# Patient Record
Sex: Male | Born: 1960 | Race: White | Hispanic: No | Smoking: Current every day smoker
Health system: Southern US, Community
[De-identification: ages and names within clinical notes are randomized; demographics above are authoritative.]

## PROBLEM LIST (undated history)

## (undated) DIAGNOSIS — F418 Other specified anxiety disorders: Secondary | ICD-10-CM

## (undated) DIAGNOSIS — Z992 Dependence on renal dialysis: Secondary | ICD-10-CM

## (undated) DIAGNOSIS — F1010 Alcohol abuse, uncomplicated: Secondary | ICD-10-CM

## (undated) DIAGNOSIS — K2210 Ulcer of esophagus without bleeding: Secondary | ICD-10-CM

## (undated) DIAGNOSIS — A048 Other specified bacterial intestinal infections: Secondary | ICD-10-CM

## (undated) DIAGNOSIS — N186 End stage renal disease: Secondary | ICD-10-CM

## (undated) DIAGNOSIS — J90 Pleural effusion, not elsewhere classified: Secondary | ICD-10-CM

## (undated) DIAGNOSIS — R0602 Shortness of breath: Secondary | ICD-10-CM

## (undated) DIAGNOSIS — R0989 Other specified symptoms and signs involving the circulatory and respiratory systems: Secondary | ICD-10-CM

## (undated) DIAGNOSIS — J942 Hemothorax: Secondary | ICD-10-CM

## (undated) DIAGNOSIS — I10 Essential (primary) hypertension: Secondary | ICD-10-CM

## (undated) DIAGNOSIS — Z720 Tobacco use: Secondary | ICD-10-CM

## (undated) DIAGNOSIS — M1990 Unspecified osteoarthritis, unspecified site: Secondary | ICD-10-CM

## (undated) HISTORY — DX: End stage renal disease: N18.6

## (undated) HISTORY — DX: Alcohol abuse, uncomplicated: F10.10

## (undated) HISTORY — PX: CHOLECYSTECTOMY: SHX55

## (undated) HISTORY — DX: Unspecified osteoarthritis, unspecified site: M19.90

## (undated) HISTORY — DX: Pleural effusion, not elsewhere classified: J90

## (undated) HISTORY — DX: Essential (primary) hypertension: I10

## (undated) HISTORY — DX: Other specified bacterial intestinal infections: A04.8

## (undated) HISTORY — DX: Tobacco use: Z72.0

## (undated) HISTORY — DX: Shortness of breath: R06.02

## (undated) HISTORY — PX: JOINT REPLACEMENT: SHX530

## (undated) HISTORY — PX: PLEURAL EFFUSION DRAINAGE: SHX5099

## (undated) HISTORY — DX: Other specified anxiety disorders: F41.8

## (undated) HISTORY — DX: Ulcer of esophagus without bleeding: K22.10

## (undated) HISTORY — DX: Hemothorax: J94.2

## (undated) HISTORY — DX: Other specified symptoms and signs involving the circulatory and respiratory systems: R09.89

---

## 2008-10-14 ENCOUNTER — Emergency Department (HOSPITAL_COMMUNITY): Admission: EM | Admit: 2008-10-14 | Discharge: 2008-10-14 | Payer: Self-pay | Admitting: Emergency Medicine

## 2008-12-11 HISTORY — PX: AV FISTULA PLACEMENT: SHX1204

## 2009-09-16 ENCOUNTER — Ambulatory Visit: Payer: Self-pay | Admitting: Cardiology

## 2009-10-18 ENCOUNTER — Ambulatory Visit: Payer: Self-pay | Admitting: Surgery

## 2009-10-21 ENCOUNTER — Ambulatory Visit: Payer: Self-pay | Admitting: Surgery

## 2009-10-21 ENCOUNTER — Ambulatory Visit (HOSPITAL_COMMUNITY): Admission: RE | Admit: 2009-10-21 | Discharge: 2009-10-21 | Payer: Self-pay | Admitting: Surgery

## 2009-11-06 ENCOUNTER — Ambulatory Visit (HOSPITAL_COMMUNITY): Admission: EM | Admit: 2009-11-06 | Discharge: 2009-11-06 | Payer: Self-pay | Admitting: Emergency Medicine

## 2009-11-15 ENCOUNTER — Ambulatory Visit: Payer: Self-pay | Admitting: Surgery

## 2009-11-29 ENCOUNTER — Ambulatory Visit: Payer: Self-pay | Admitting: Surgery

## 2009-12-02 ENCOUNTER — Ambulatory Visit: Payer: Self-pay | Admitting: Surgery

## 2009-12-02 ENCOUNTER — Ambulatory Visit (HOSPITAL_COMMUNITY): Admission: RE | Admit: 2009-12-02 | Discharge: 2009-12-02 | Payer: Self-pay | Admitting: Surgery

## 2010-02-18 ENCOUNTER — Ambulatory Visit (HOSPITAL_COMMUNITY): Admission: RE | Admit: 2010-02-18 | Discharge: 2010-02-18 | Payer: Self-pay | Admitting: Nephrology

## 2010-04-24 ENCOUNTER — Emergency Department (HOSPITAL_COMMUNITY): Admission: EM | Admit: 2010-04-24 | Discharge: 2010-04-24 | Payer: Self-pay | Admitting: Emergency Medicine

## 2010-05-11 HISTORY — PX: TOTAL HIP ARTHROPLASTY: SHX124

## 2010-07-16 ENCOUNTER — Emergency Department (HOSPITAL_COMMUNITY): Admission: EM | Admit: 2010-07-16 | Discharge: 2010-07-16 | Payer: Self-pay | Admitting: Emergency Medicine

## 2010-07-17 ENCOUNTER — Emergency Department (HOSPITAL_COMMUNITY): Admission: EM | Admit: 2010-07-17 | Discharge: 2010-07-17 | Payer: Self-pay | Admitting: Emergency Medicine

## 2010-07-24 ENCOUNTER — Emergency Department (HOSPITAL_COMMUNITY): Admission: EM | Admit: 2010-07-24 | Discharge: 2010-07-24 | Payer: Self-pay | Admitting: Emergency Medicine

## 2010-10-07 ENCOUNTER — Emergency Department (HOSPITAL_COMMUNITY): Admission: EM | Admit: 2010-10-07 | Discharge: 2010-10-08 | Payer: Self-pay | Admitting: Emergency Medicine

## 2010-10-12 ENCOUNTER — Emergency Department (HOSPITAL_COMMUNITY): Admission: EM | Admit: 2010-10-12 | Discharge: 2010-10-12 | Payer: Self-pay | Admitting: Emergency Medicine

## 2010-10-22 ENCOUNTER — Emergency Department (HOSPITAL_COMMUNITY): Admission: EM | Admit: 2010-10-22 | Discharge: 2010-10-22 | Payer: Self-pay | Admitting: Emergency Medicine

## 2010-11-10 ENCOUNTER — Emergency Department (HOSPITAL_COMMUNITY)
Admission: EM | Admit: 2010-11-10 | Discharge: 2010-11-11 | Payer: Self-pay | Source: Home / Self Care | Admitting: Psychology

## 2011-02-21 LAB — DIFFERENTIAL
Eosinophils Absolute: 0.3 10*3/uL (ref 0.0–0.7)
Lymphocytes Relative: 22 % (ref 12–46)
Lymphs Abs: 1.7 10*3/uL (ref 0.7–4.0)
Lymphs Abs: 2.3 10*3/uL (ref 0.7–4.0)
Monocytes Absolute: 0.6 10*3/uL (ref 0.1–1.0)
Monocytes Relative: 6 % (ref 3–12)
Monocytes Relative: 7 % (ref 3–12)
Neutro Abs: 5.1 10*3/uL (ref 1.7–7.7)
Neutrophils Relative %: 66 % (ref 43–77)
Neutrophils Relative %: 69 % (ref 43–77)

## 2011-02-21 LAB — COMPREHENSIVE METABOLIC PANEL
CO2: 24 mEq/L (ref 19–32)
Calcium: 8.5 mg/dL (ref 8.4–10.5)
Creatinine, Ser: 8.62 mg/dL — ABNORMAL HIGH (ref 0.4–1.5)
GFR calc non Af Amer: 7 mL/min — ABNORMAL LOW (ref >60)
Glucose, Bld: 111 mg/dL — ABNORMAL HIGH (ref 70–99)
Sodium: 134 mEq/L — ABNORMAL LOW (ref 135–145)
Total Protein: 6.6 g/dL (ref 6.0–8.3)

## 2011-02-21 LAB — CBC
HCT: 31.3 % — ABNORMAL LOW (ref 39.0–52.0)
HCT: 38.2 % — ABNORMAL LOW (ref 39.0–52.0)
Hemoglobin: 10.4 g/dL — ABNORMAL LOW (ref 13.0–17.0)
Hemoglobin: 12.6 g/dL — ABNORMAL LOW (ref 13.0–17.0)
MCH: 28.8 pg (ref 26.0–34.0)
MCH: 28.9 pg (ref 26.0–34.0)
MCHC: 33.2 g/dL (ref 30.0–36.0)
MCV: 87.7 fL (ref 78.0–100.0)
RBC: 4.36 MIL/uL (ref 4.22–5.81)
RDW: 16.2 % — ABNORMAL HIGH (ref 11.5–15.5)

## 2011-02-21 LAB — HEPATIC FUNCTION PANEL
Bilirubin, Direct: 0.2 mg/dL (ref 0.0–0.3)
Indirect Bilirubin: 0.5 mg/dL (ref 0.3–0.9)

## 2011-02-21 LAB — BASIC METABOLIC PANEL
CO2: 23 mEq/L (ref 19–32)
Calcium: 9.4 mg/dL (ref 8.4–10.5)
Chloride: 100 mEq/L (ref 96–112)
GFR calc Af Amer: 9 mL/min — ABNORMAL LOW (ref >60)
Glucose, Bld: 106 mg/dL — ABNORMAL HIGH (ref 70–99)
Potassium: 4.9 mEq/L (ref 3.5–5.1)
Sodium: 136 mEq/L (ref 135–145)

## 2011-02-21 LAB — POCT CARDIAC MARKERS: Myoglobin, poc: 300 ng/mL (ref 12–200)

## 2011-02-21 LAB — LIPASE, BLOOD: Lipase: 98 U/L — ABNORMAL HIGH (ref 11–59)

## 2011-02-22 LAB — CBC
HCT: 34.5 % — ABNORMAL LOW (ref 39.0–52.0)
MCHC: 33.3 g/dL (ref 30.0–36.0)
MCV: 86.1 fL (ref 78.0–100.0)
RDW: 16.4 % — ABNORMAL HIGH (ref 11.5–15.5)

## 2011-02-22 LAB — COMPREHENSIVE METABOLIC PANEL
Alkaline Phosphatase: 80 U/L (ref 39–117)
BUN: 21 mg/dL (ref 6–23)
Calcium: 9 mg/dL (ref 8.4–10.5)
GFR calc non Af Amer: 7 mL/min — ABNORMAL LOW (ref >60)
Glucose, Bld: 102 mg/dL — ABNORMAL HIGH (ref 70–99)
Total Protein: 6.6 g/dL (ref 6.0–8.3)

## 2011-02-22 LAB — DIFFERENTIAL
Basophils Relative: 1 % (ref 0–1)
Monocytes Relative: 5 % (ref 3–12)
Neutro Abs: 6.5 10*3/uL (ref 1.7–7.7)
Neutrophils Relative %: 72 % (ref 43–77)

## 2011-02-22 LAB — LIPASE, BLOOD: Lipase: 52 U/L (ref 11–59)

## 2011-02-24 LAB — BASIC METABOLIC PANEL
CO2: 27 mEq/L (ref 19–32)
CO2: 30 mEq/L (ref 19–32)
Calcium: 8.7 mg/dL (ref 8.4–10.5)
Calcium: 8.8 mg/dL (ref 8.4–10.5)
Chloride: 101 mEq/L (ref 96–112)
GFR calc Af Amer: 10 mL/min — ABNORMAL LOW (ref >60)
GFR calc Af Amer: 13 mL/min — ABNORMAL LOW (ref >60)
GFR calc non Af Amer: 10 mL/min — ABNORMAL LOW (ref >60)
Glucose, Bld: 84 mg/dL (ref 70–99)
Potassium: 4 mEq/L (ref 3.5–5.1)
Sodium: 133 mEq/L — ABNORMAL LOW (ref 135–145)
Sodium: 136 mEq/L (ref 135–145)

## 2011-02-24 LAB — CK TOTAL AND CKMB (NOT AT ARMC): Relative Index: INVALID (ref 0.0–2.5)

## 2011-02-24 LAB — DIFFERENTIAL
Basophils Relative: 1 % (ref 0–1)
Lymphocytes Relative: 31 % (ref 12–46)
Lymphocytes Relative: 36 % (ref 12–46)
Lymphocytes Relative: 37 % (ref 12–46)
Lymphs Abs: 3.1 10*3/uL (ref 0.7–4.0)
Lymphs Abs: 3.3 10*3/uL (ref 0.7–4.0)
Monocytes Absolute: 0.6 10*3/uL (ref 0.1–1.0)
Monocytes Relative: 5 % (ref 3–12)
Monocytes Relative: 6 % (ref 3–12)
Monocytes Relative: 8 % (ref 3–12)
Neutro Abs: 4.2 10*3/uL (ref 1.7–7.7)
Neutro Abs: 4.8 10*3/uL (ref 1.7–7.7)
Neutro Abs: 7 10*3/uL (ref 1.7–7.7)
Neutrophils Relative %: 51 % (ref 43–77)
Neutrophils Relative %: 54 % (ref 43–77)
Neutrophils Relative %: 58 % (ref 43–77)

## 2011-02-24 LAB — CBC
HCT: 31.7 % — ABNORMAL LOW (ref 39.0–52.0)
HCT: 35.1 % — ABNORMAL LOW (ref 39.0–52.0)
Hemoglobin: 10.6 g/dL — ABNORMAL LOW (ref 13.0–17.0)
Hemoglobin: 11.4 g/dL — ABNORMAL LOW (ref 13.0–17.0)
MCH: 28.5 pg (ref 26.0–34.0)
MCH: 29.3 pg (ref 26.0–34.0)
MCHC: 32.5 g/dL (ref 30.0–36.0)
MCHC: 32.9 g/dL (ref 30.0–36.0)
MCV: 87.8 fL (ref 78.0–100.0)
MCV: 88.2 fL (ref 78.0–100.0)
Platelets: 303 10*3/uL (ref 150–400)
Platelets: 304 10*3/uL (ref 150–400)
RBC: 3.6 MIL/uL — ABNORMAL LOW (ref 4.22–5.81)
RBC: 4 MIL/uL — ABNORMAL LOW (ref 4.22–5.81)
RDW: 15.5 % (ref 11.5–15.5)
WBC: 8.3 10*3/uL (ref 4.0–10.5)
WBC: 9 10*3/uL (ref 4.0–10.5)

## 2011-02-24 LAB — URINALYSIS, ROUTINE W REFLEX MICROSCOPIC
Glucose, UA: NEGATIVE mg/dL
Leukocytes, UA: NEGATIVE
Specific Gravity, Urine: 1.015 (ref 1.005–1.030)
pH: 8 (ref 5.0–8.0)

## 2011-02-24 LAB — COMPREHENSIVE METABOLIC PANEL
Albumin: 3 g/dL — ABNORMAL LOW (ref 3.5–5.2)
Alkaline Phosphatase: 78 U/L (ref 39–117)
BUN: 12 mg/dL (ref 6–23)
Calcium: 8.6 mg/dL (ref 8.4–10.5)
Glucose, Bld: 89 mg/dL (ref 70–99)
Potassium: 3.7 mEq/L (ref 3.5–5.1)
Sodium: 129 mEq/L — ABNORMAL LOW (ref 135–145)
Total Protein: 6.3 g/dL (ref 6.0–8.3)

## 2011-02-24 LAB — URINE CULTURE
Colony Count: NO GROWTH
Culture  Setup Time: 201108151710
Culture: NO GROWTH

## 2011-02-24 LAB — URINE MICROSCOPIC-ADD ON

## 2011-02-24 LAB — RAPID URINE DRUG SCREEN, HOSP PERFORMED
Cocaine: NOT DETECTED
Opiates: POSITIVE — AB
Tetrahydrocannabinol: NOT DETECTED

## 2011-02-24 LAB — ETHANOL: Alcohol, Ethyl (B): <5 mg/dL (ref 0–10)

## 2011-02-27 LAB — COMPREHENSIVE METABOLIC PANEL
ALT: 13 U/L (ref 0–53)
AST: 19 U/L (ref 0–37)
Albumin: 3.5 g/dL (ref 3.5–5.2)
CO2: 24 mEq/L (ref 19–32)
Calcium: 8.3 mg/dL — ABNORMAL LOW (ref 8.4–10.5)
Creatinine, Ser: 6.64 mg/dL — ABNORMAL HIGH (ref 0.4–1.5)
GFR calc Af Amer: 11 mL/min — ABNORMAL LOW (ref >60)
GFR calc non Af Amer: 9 mL/min — ABNORMAL LOW (ref >60)
Sodium: 127 mEq/L — ABNORMAL LOW (ref 135–145)
Total Protein: 7 g/dL (ref 6.0–8.3)

## 2011-02-27 LAB — DIFFERENTIAL
Eosinophils Absolute: 0.5 10*3/uL (ref 0.0–0.7)
Eosinophils Relative: 4 % (ref 0–5)
Lymphocytes Relative: 26 % (ref 12–46)
Lymphs Abs: 2.8 10*3/uL (ref 0.7–4.0)
Monocytes Absolute: 0.9 10*3/uL (ref 0.1–1.0)
Monocytes Relative: 8 % (ref 3–12)

## 2011-02-27 LAB — POCT I-STAT, CHEM 8
Calcium, Ion: 0.93 mmol/L — ABNORMAL LOW (ref 1.12–1.32)
Chloride: 95 mEq/L — ABNORMAL LOW (ref 96–112)
Glucose, Bld: 89 mg/dL (ref 70–99)
HCT: 47 % (ref 39.0–52.0)
Hemoglobin: 16 g/dL (ref 13.0–17.0)
Potassium: 3.8 mEq/L (ref 3.5–5.1)

## 2011-02-27 LAB — CBC
Hemoglobin: 14.6 g/dL (ref 13.0–17.0)
MCV: 90.7 fL (ref 78.0–100.0)
RBC: 4.66 MIL/uL (ref 4.22–5.81)
WBC: 11.1 10*3/uL — ABNORMAL HIGH (ref 4.0–10.5)

## 2011-02-27 LAB — GLUCOSE, CAPILLARY: Glucose-Capillary: 100 mg/dL — ABNORMAL HIGH (ref 70–99)

## 2011-02-27 LAB — POCT I-STAT 3, ART BLOOD GAS (G3+)
Acid-Base Excess: 2 mmol/L (ref 0.0–2.0)
O2 Saturation: 99 %
TCO2: 30 mmol/L (ref 0–100)
pCO2 arterial: 51 mmHg — ABNORMAL HIGH (ref 35.0–45.0)
pO2, Arterial: 125 mmHg — ABNORMAL HIGH (ref 80.0–100.0)

## 2011-02-27 LAB — PROTIME-INR: Prothrombin Time: 13.2 seconds (ref 11.6–15.2)

## 2011-02-27 LAB — TROPONIN I: Troponin I: 0.01 ng/mL (ref 0.00–0.06)

## 2011-02-27 LAB — POCT CARDIAC MARKERS: CKMB, poc: <1 ng/mL — ABNORMAL LOW (ref 1.0–8.0)

## 2011-02-27 LAB — CK TOTAL AND CKMB (NOT AT ARMC)
CK, MB: 1.2 ng/mL (ref 0.3–4.0)
Relative Index: INVALID (ref 0.0–2.5)
Total CK: 92 U/L (ref 7–232)

## 2011-02-27 LAB — ETHANOL: Alcohol, Ethyl (B): 161 mg/dL — ABNORMAL HIGH (ref 0–10)

## 2011-03-13 LAB — POCT I-STAT, CHEM 8
Calcium, Ion: 1.05 mmol/L — ABNORMAL LOW (ref 1.12–1.32)
Chloride: 98 mEq/L (ref 96–112)
Creatinine, Ser: 6 mg/dL — ABNORMAL HIGH (ref 0.4–1.5)
Glucose, Bld: 94 mg/dL (ref 70–99)
Potassium: 5 mEq/L (ref 3.5–5.1)

## 2011-03-15 LAB — POCT I-STAT 4, (NA,K, GLUC, HGB,HCT)
Glucose, Bld: 94 mg/dL (ref 70–99)
HCT: 43 % (ref 39.0–52.0)
Hemoglobin: 14.6 g/dL (ref 13.0–17.0)
Potassium: 4.6 mEq/L (ref 3.5–5.1)
Sodium: 137 mEq/L (ref 135–145)

## 2011-03-15 LAB — GLUCOSE, CAPILLARY
Glucose-Capillary: 108 mg/dL — ABNORMAL HIGH (ref 70–99)
Glucose-Capillary: 87 mg/dL (ref 70–99)

## 2011-03-30 ENCOUNTER — Ambulatory Visit (INDEPENDENT_AMBULATORY_CARE_PROVIDER_SITE_OTHER): Payer: Medicare Other | Admitting: Internal Medicine

## 2011-03-30 DIAGNOSIS — R1013 Epigastric pain: Secondary | ICD-10-CM
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
## 2011-04-25 NOTE — Assessment & Plan Note (Signed)
OFFICE VISIT   [name], [name]  DOB:  11-25-1961                                       11/29/2009  CHART#:[id number]   Patient comes back today.  He developed steal syndrome after undergoing  a left upper arm fistula.  His fistula has since been ligated.  His  strength is improved, although he does still have some numbness.   I plan on performing a left and right upper arm angiogram to evaluate  for new access sites.  This is going to be on Thursday, December 24th.     Jorge Ny, MD  Electronically Signed   VWB/MEDQ  D:  11/29/2009  T:  11/30/2009  Job:  [id number]   cc:   Jorja Loa, M.D.

## 2011-04-25 NOTE — Assessment & Plan Note (Signed)
OFFICE VISIT   SEMISI, BIELA  DOB:  1961-07-11                                       11/15/2009  CHART#:[id number]   Mr. Ausmus is a 50 year old gentleman now on dialysis.  He had a left  upper arm fistula placed on November 11.  The patient did well initially  for a week but then began developing left hand pain which began to  progress to the point where he notified his dialysis center.  When he  notified his dialysis center, it had been going on for approximately 5  days and his hand had become numb.  He had loss some use of it.  He was  immediately taken for ligation of his fistula.  He comes back in today  for followup.  He is still complaining of some weakness in his left  hand.  He does have some swelling up around his antecubital crease.  He  has gotten 3 doses of antibiotics at the dialysis center.   I would recommend continuing the antibiotics for at least 2 weeks.  I  wrapped his arm in an Ace wrap to help some swelling and have encouraged  him to keep his arm elevated.  I am going to see him back in 2 weeks to  how he is doing.  I have told that we would no longer recommend access  in his left arm.  I will plan on getting an arteriogram with right arm  evaluation prior to proceeding with a right arm procedure.   Jorge Ny, MD  Electronically Signed   VWB/MEDQ  D:  11/15/2009  T:  11/16/2009  Job:  [id number]   cc:   Jorja Loa, M.D.

## 2011-04-25 NOTE — Procedures (Signed)
CEPHALIC VEIN MAPPING   INDICATION:  Preoperative evaluation of AV fistula graft placement   HISTORY:  Renal failure   EXAM:  The right cephalic vein is compressible.   Diameter measurements range from 0.14 to 0.26 cm.   The right basilic vein is compressible.   Diameter measurements range from 0.15 to 0.39 cm.   See attached worksheet for all measurements.   IMPRESSION:  Patent right cephalic vein which is not of acceptable  diameter for use as a dialysis access site.   ___________________________________________  V. Charlena Cross, MD   CB/MEDQ  D:  10/19/2009  T:  10/20/2009  Job:  [id number]

## 2011-05-13 IMAGING — CR DG CHEST 1V
1 series · 1 of 1 positions shown · non-contrast
Comparison: 10/22/2010.

CLINICAL DATA: Fall, right-sided rib pain

CHEST - 1 VIEW

[view not recorded]
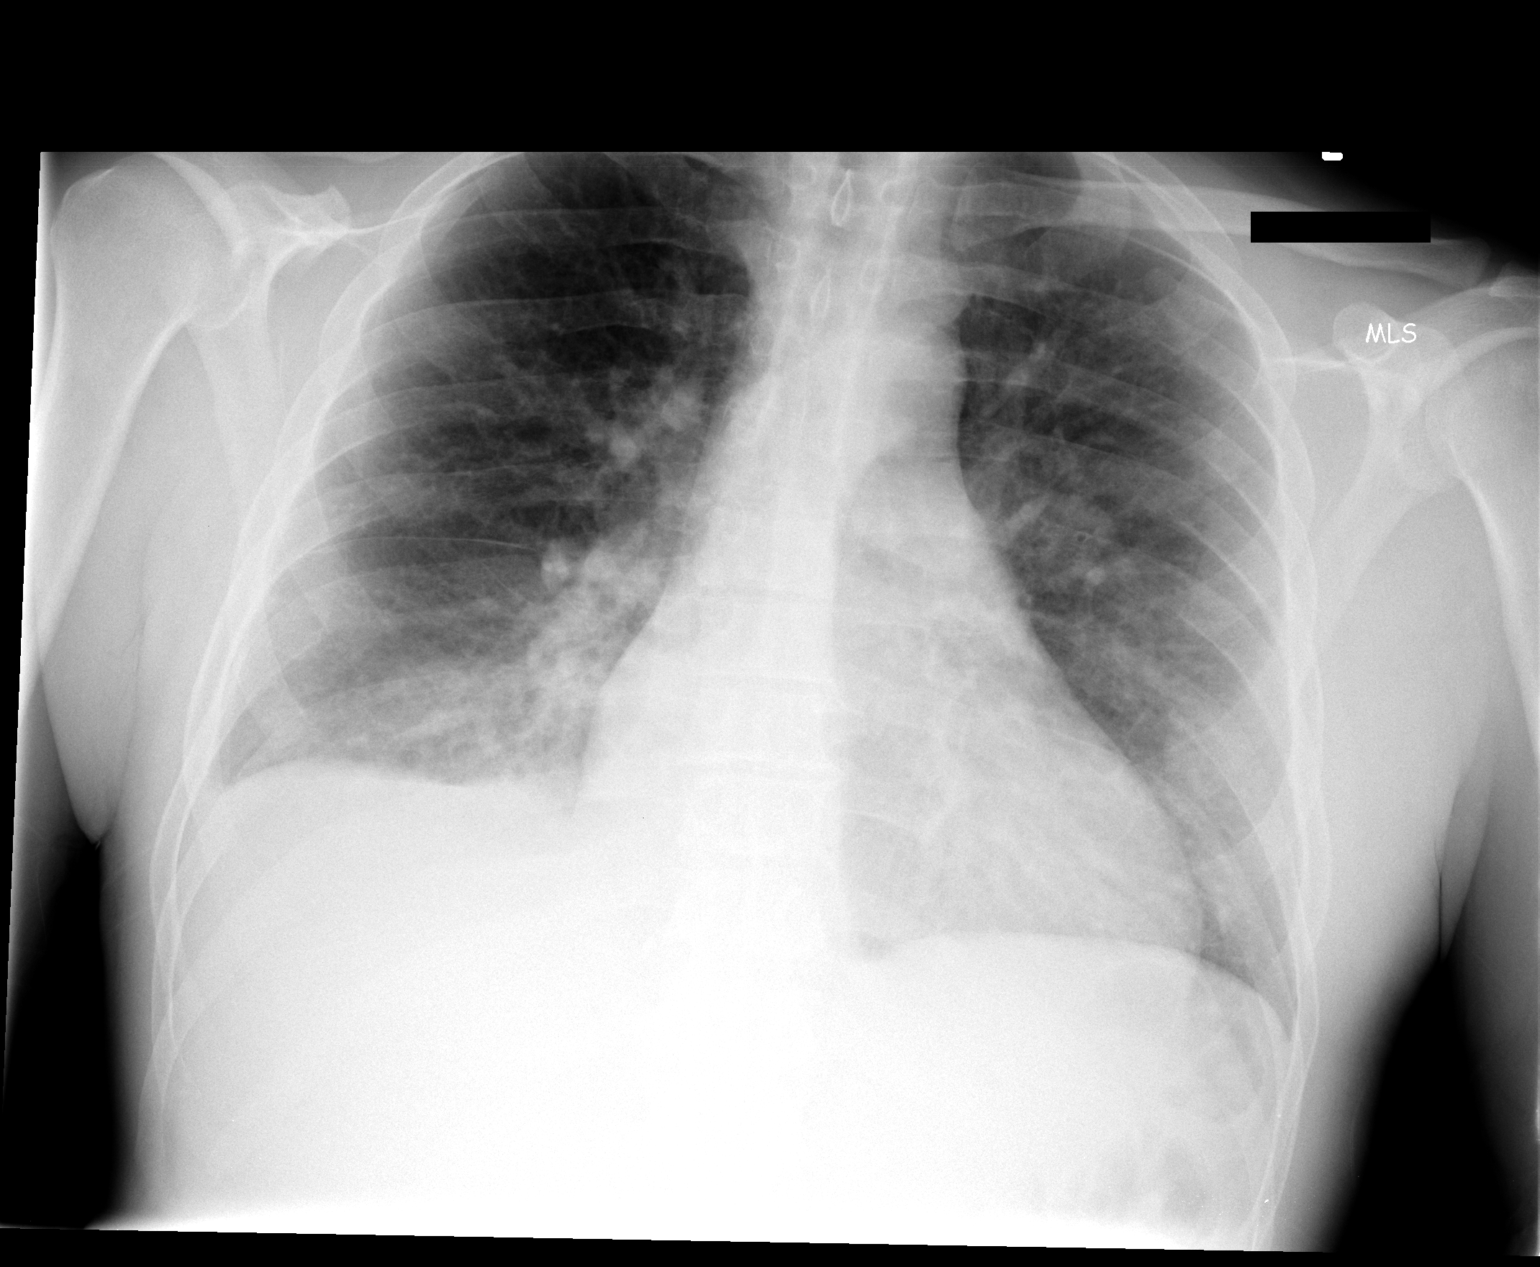

[1 of 1 positions shown; findings below may reference images not displayed]

FINDINGS: The opacity seen in the right lung base of the wall
thickening and the heart is mildly enlarged.  The upper abdomen is
normal.  No displaced rib fractures are seen.
IMPRESSION: No interval change in the right lower lobe pneumonia and a small
associated pleural effusion.  No displaced rib fractures.

## 2011-05-13 IMAGING — CR DG HIP COMPLETE 2+V*R*
3 series · 3 of 3 positions shown · non-contrast
Comparison: 07/16/2010

CLINICAL DATA: Fall, right hip pain.

RIGHT HIP - COMPLETE 2+ VIEW

[view not recorded (1 of 3)]
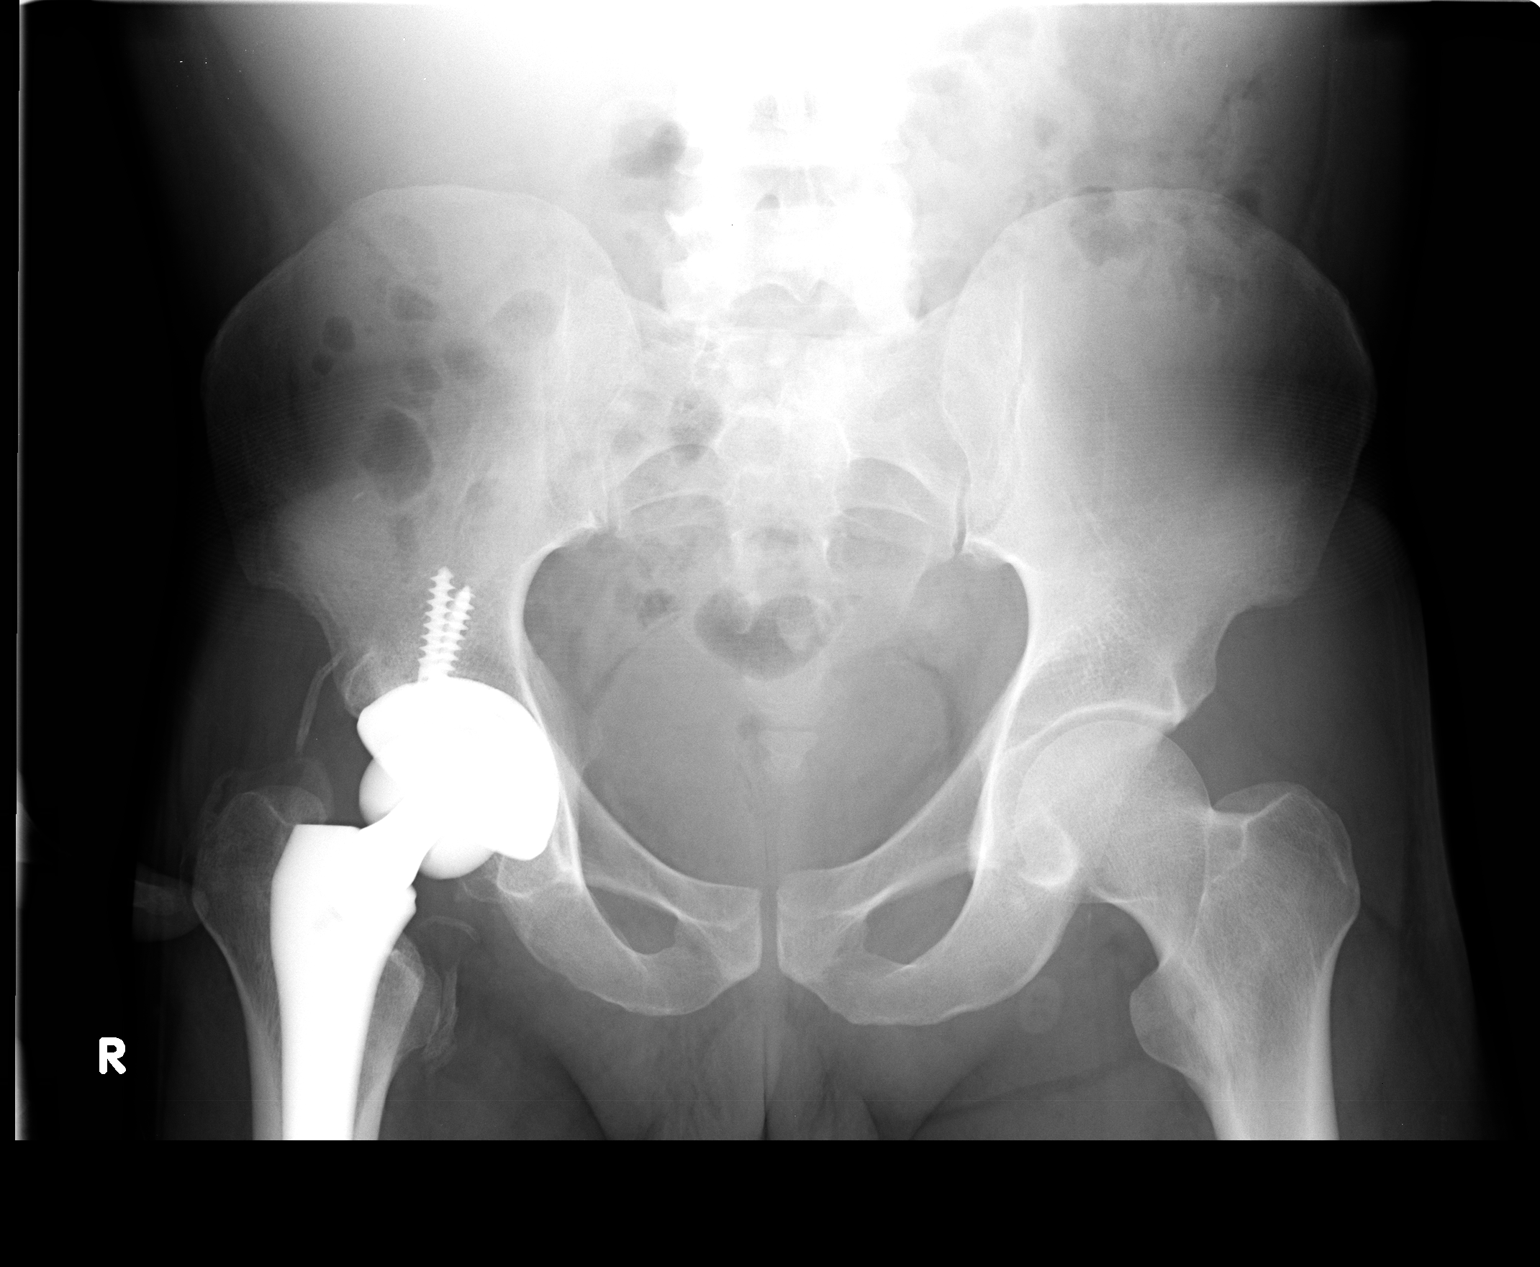

[view not recorded (2 of 3)]
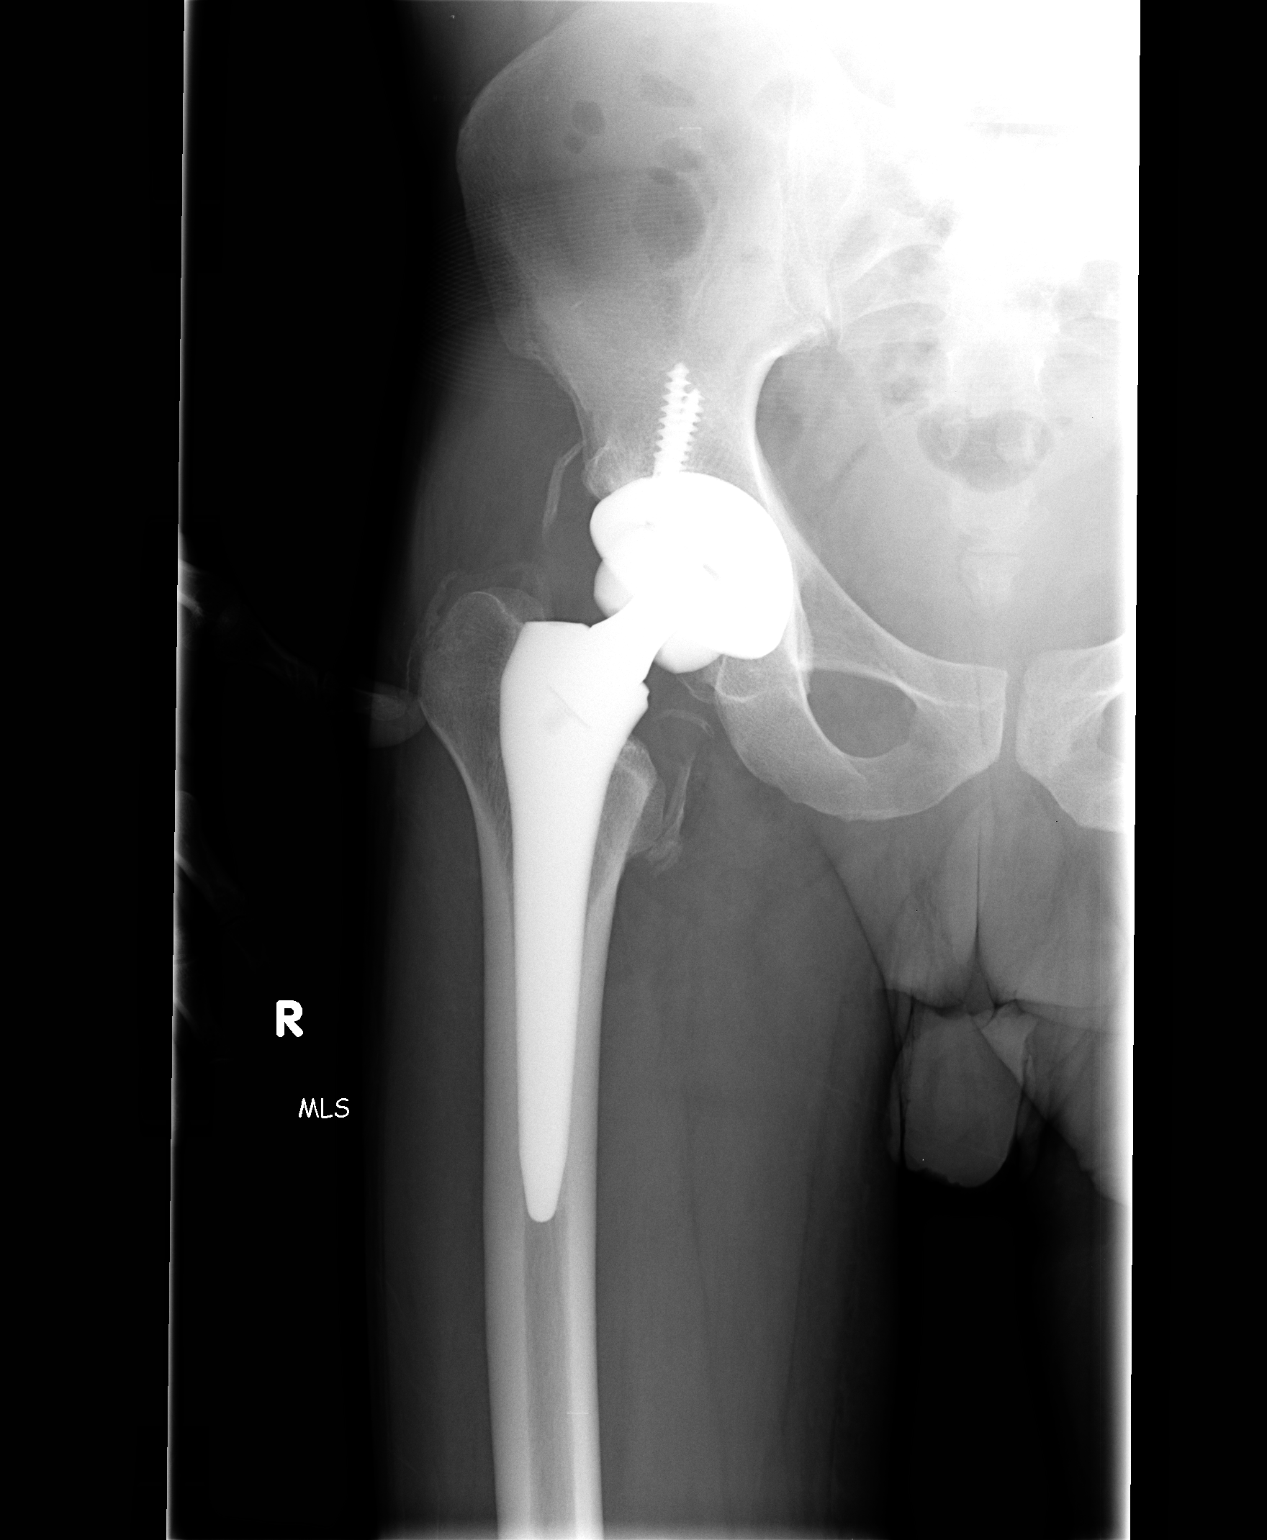

[view not recorded (3 of 3)]
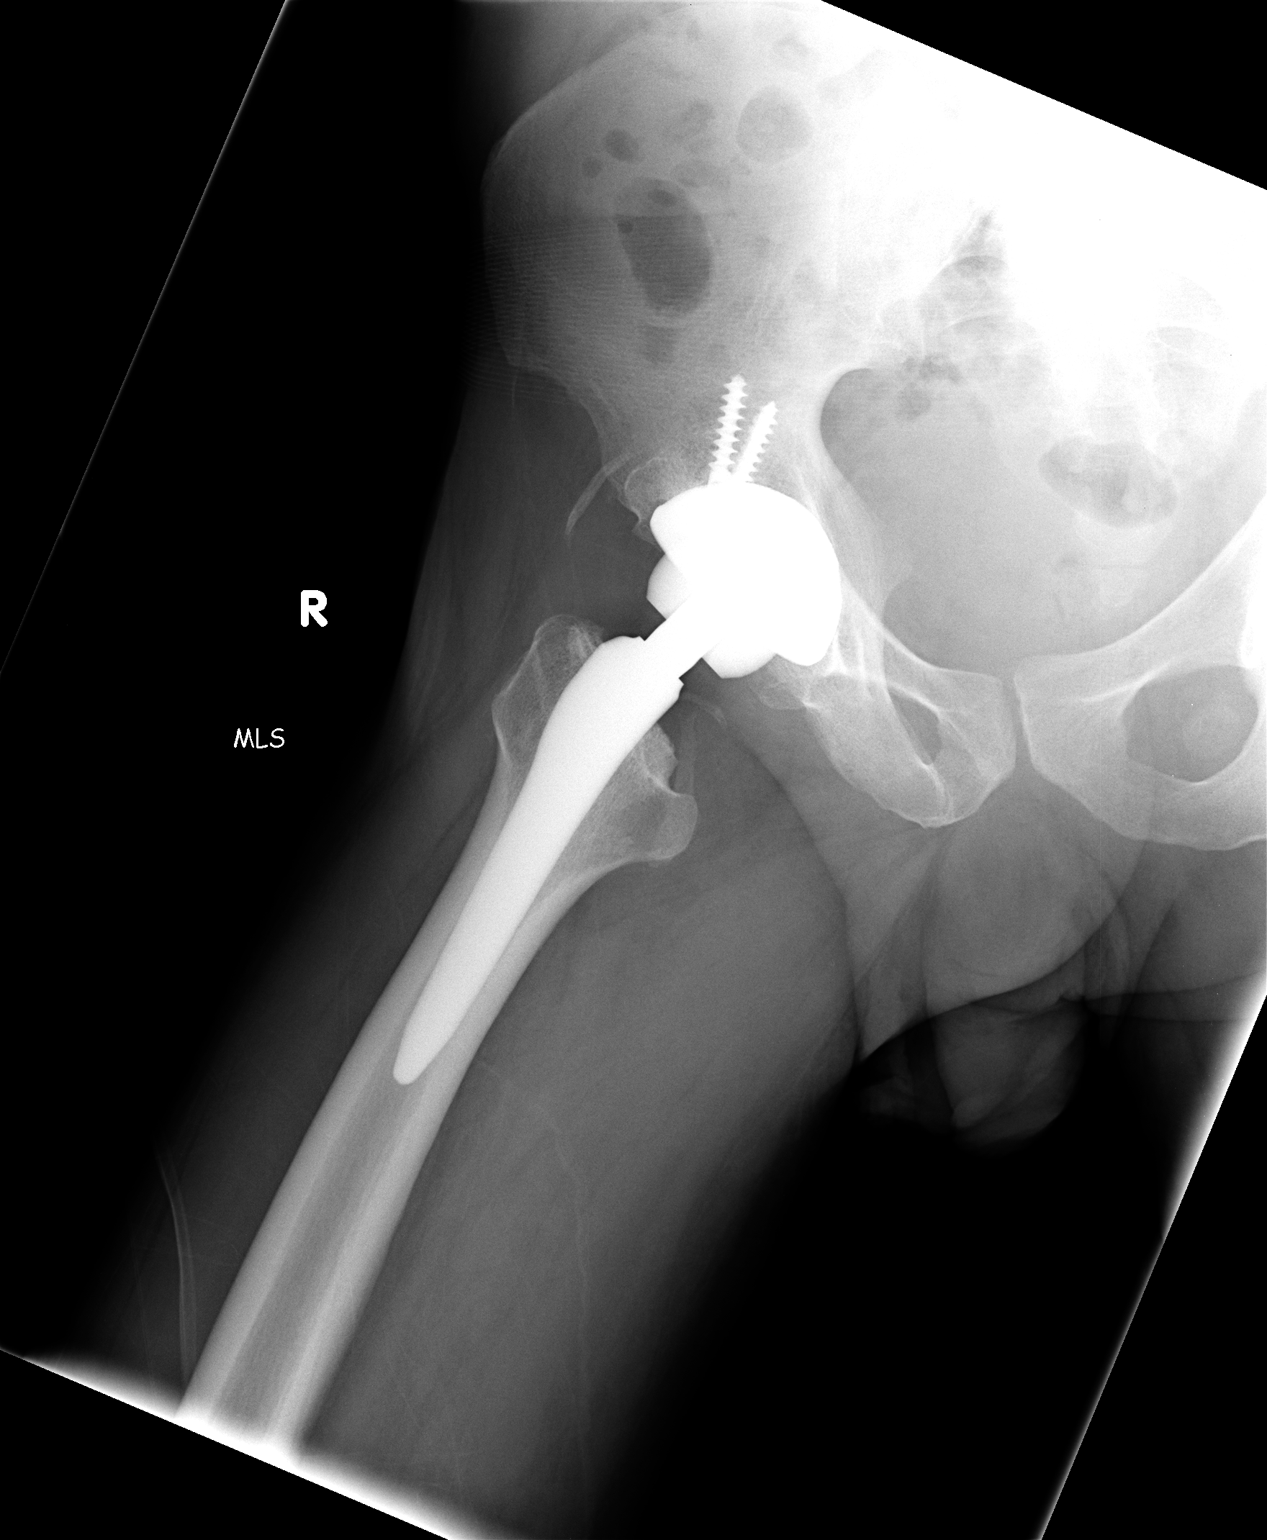

[3 of 3 positions shown; findings below may reference images not displayed]

FINDINGS: Postsurgical changes of total right hip replacement seen.
There is no evidence of peri prostatic fracture or dislocation.
Dystrophic/heterotopic calcifications are again seen around the
right hip.  The left hip is normal.  The soft tissues are within
normal limits.
IMPRESSION: No acute findings.

## 2011-05-18 ENCOUNTER — Encounter: Payer: Self-pay | Admitting: Internal Medicine

## 2011-05-19 ENCOUNTER — Ambulatory Visit (INDEPENDENT_AMBULATORY_CARE_PROVIDER_SITE_OTHER): Payer: Medicare Other | Admitting: Internal Medicine

## 2011-05-19 ENCOUNTER — Encounter: Payer: Self-pay | Admitting: Internal Medicine

## 2011-05-19 ENCOUNTER — Ambulatory Visit (INDEPENDENT_AMBULATORY_CARE_PROVIDER_SITE_OTHER)
Admission: RE | Admit: 2011-05-19 | Discharge: 2011-05-19 | Disposition: A | Discharge status: Home / Self Care | Payer: Medicare Other | Source: Ambulatory Visit | Attending: Internal Medicine | Admitting: Internal Medicine

## 2011-05-19 VITALS — BP 126/80 | HR 81 | Temp 98.1°F | Ht 66.5 in | Wt 146.0 lb

## 2011-05-19 DIAGNOSIS — J90 Pleural effusion, not elsewhere classified: Secondary | ICD-10-CM | POA: Insufficient documentation

## 2011-05-19 MED ORDER — HYDROCODONE-ACETAMINOPHEN 5-500 MG PO TABS: ORAL_TABLET | ORAL | Status: DC

## 2011-05-19 NOTE — Patient Instructions (Addendum)
Please see patient coordinator before you leave today  to schedule evaluation for VATS   by surgeon - if your condition worsens in meantime please go to [facility] ER for evaluation and possible admission  No smoking at all  For pain or cough or short of breath try vicodin 5/500 one every 4 hours

## 2011-05-19 NOTE — Progress Notes (Signed)
Subjective:     Patient ID: [name], male   DOB: May 03, 1961, 50 y.o.   MRN: [id number]  HPI 5 yowm active smoker with h/o asthma as child but no sign respiratory problems until around 03/2011 with new onset sob referred to pulmonary clinic 05/2011 by Dr Aleen Campi for eval of a new R Pl effuion  05/19/2011 Initial pulmonary office eval cc sob x sev months assoc with large R effusion tapped off 04/20/11 only better that day then worse again.  Onset was acute with fever / weakness/ cough/ r pleuritic cp >  No  Assoc leg swelling or arthralgias.  Breathing no better with advair. Already treated with abx but doesn't know name.  PMH R Pleural Effusion    - U/S guided Right  Tcentesis at [facility] 04/20/11 x 300 cc serosanguinous fluid with 4.3 gm prot/ 20K RBC/ 500 WBC L> P   Review of Systems  Constitutional: Positive for fever, chills, activity change, appetite change and unexpected weight change.  HENT: Positive for congestion, sore throat, rhinorrhea, trouble swallowing and voice change. Negative for sneezing, dental problem and postnasal drip.   Eyes: Positive for visual disturbance.  Respiratory: Positive for cough and shortness of breath. Negative for choking.   Cardiovascular: Positive for chest pain and leg swelling.  Gastrointestinal: Positive for nausea, vomiting and abdominal pain.  Genitourinary: Negative for difficulty urinating.  Musculoskeletal: Negative for arthralgias.  Skin: Negative for rash.  Psychiatric/Behavioral: Negative for behavioral problems and confusion.       Objective:   Physical Exam    amb wm nad Wt 146 05/19/2011  HEENT mild turbinate edema.  Oropharynx no thrush or excess pnd or cobblestoning.  No JVD or cervical adenopathy. Mild accessory muscle hypertrophy. Trachea midline, nl thryroid. Chest was hyperinflated by percussion with diminished breath sounds and moderate increased exp time without wheeze and dullnessness R lower half of chest . Hoover  sign positive at mid inspiration. Regular rate and rhythm without murmur gallop or rub or increase P2 or edema.  Abd: no hsm, nl excursion. Ext warm without cyanosis or clubbing.    cxr  05/19/2011  Large right effusion with possible underlying right lower lobe pathology - new finding since 11/11/2010.   Assessment:          Plan:

## 2011-05-19 NOTE — Assessment & Plan Note (Signed)
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  Discussed in detail all the  indications, usual  risks and alternatives  relative to the benefits with patient who agrees to proceed with T surgery consultation and try to stop smoking in meantime.   Can use vicodin in meantime for pain or sob or cough and go to er in meantime if get worse.

## 2011-05-24 ENCOUNTER — Encounter (INDEPENDENT_AMBULATORY_CARE_PROVIDER_SITE_OTHER): Payer: Medicare Other | Admitting: Thoracic Surgery

## 2011-05-24 DIAGNOSIS — J90 Pleural effusion, not elsewhere classified: Secondary | ICD-10-CM

## 2011-05-25 NOTE — Letter (Signed)
May 24, 2011  R. Roetta Sessions, MD FACP [facility] [address] [place] [address]  Re:  Bob, [name]               DOB:  1961/10/19  Dear Dr. Jena [name]:  I appreciate the opportunity of seeing the patient.  This is a 50 year old patient who has had end-stage renal disease on dialysis, has a left upper arm cephalic fistula.  He has had at least a few month history of right pleural effusion and had a thoracentesis of 400 mL which was nondiagnostic.  He is referred here for evaluation of his right pleural effusion.  He has had no fever, chills or excessive sputum.  He recently had extravasation in his left upper arm fistula which is causing some pain.  He says the right pleural effusion does cause him pain and he takes Vicodin for this.  His access work has been done at [facility].  MEDICATIONS:  Aspirin, amantadine, Coumadin, Advair, folic acid, selenium, Lasix, Vicodin, Prilosec, Spiriva and Prevacid.  ALLERGIES:  He is allergic to enteric coated aspirin, morphine, nonsteroidals and codeine.  He has chronic obstructive pulmonary disease as well as hypertension.  FAMILY HISTORY:  Noncontributory.  SOCIAL HISTORY:  He has 1 child.  He still smokes about a pack of cigarettes a day.  Does not drink alcohol on a regular basis.  REVIEW OF SYSTEMS:  GENERAL:  His weight has been stable. CARDIAC:  No recent angina or atrial fibrillation. PULMONARY:  No hemoptysis.  He has had a cough. GI:  He has had peptic ulcer disease and reflux. GU:  Kidney disease.  See history of present illness. VASCULAR:  No claudication, DVT, TIAs.  Neurologic:  No dizziness headaches, blackouts, seizures. MUSCULOSKELETAL:  No arthritis. PSYCHIATRIC:  No depression or nervous. EYE AND ENT:  No changes in eyesight or hearing. HEMATOLOGICAL:  No problems with bleeding, clotting disorders or anemia.  PHYSICAL EXAMINATION:  General:  He is a well-developed Caucasian male in no acute distress.  Vital Signs:   His blood pressure is 147/88, pulse 84, respirations 18, sats were 96%.  HEAD, EYES, EARS, NOSE AND THROAT: Unremarkable.  Neck:  Supple without thyromegaly.  There is no supraclavicular or axillary adenopathy.  Chest:  Clear to auscultation and percussion on the left and decreased breath sounds on the right and increased dullness to percussion on the right.  Heart:  Regular sinus rhythm.  No murmurs.  Abdomen:  Soft.  Bowel sounds are normal. Extremities:  Pulses are 2+.  Left upper arm extremity he has an upper cephalic fistula.  Neurological:  He is oriented x3.  Sensory and motor are intact.  PLAN:  He dialyzes on Tuesday, Thursday and Saturday.  I plan to admit him to the hospital on Monday to undergo a right VATS drainage of pleural effusion, talc pleurodesis, pleural biopsies and insertion of Pleurx catheter.  I explained the risks and benefits of the procedure to the patient.  He understands the risk and agrees to surgery.  I appreciate the opportunity of seeing the patient.  [name], M.D. Electronically Signed  DPB/MEDQ  D:  05/24/2011  T:  05/25/2011  Job:  [id number]

## 2011-06-08 ENCOUNTER — Other Ambulatory Visit (HOSPITAL_COMMUNITY): Payer: Medicare Other

## 2011-06-09 ENCOUNTER — Telehealth: Payer: Self-pay

## 2011-06-09 ENCOUNTER — Emergency Department (HOSPITAL_COMMUNITY)
Admission: EM | Admit: 2011-06-09 | Discharge: 2011-06-10 | Disposition: A | Discharge status: Home / Self Care | Payer: Medicare Other | Attending: Emergency Medicine | Admitting: Emergency Medicine

## 2011-06-09 ENCOUNTER — Emergency Department (HOSPITAL_COMMUNITY): Payer: Medicare Other

## 2011-06-09 DIAGNOSIS — R0989 Other specified symptoms and signs involving the circulatory and respiratory systems: Secondary | ICD-10-CM | POA: Insufficient documentation

## 2011-06-09 DIAGNOSIS — N186 End stage renal disease: Secondary | ICD-10-CM | POA: Insufficient documentation

## 2011-06-09 DIAGNOSIS — M7989 Other specified soft tissue disorders: Secondary | ICD-10-CM | POA: Insufficient documentation

## 2011-06-09 DIAGNOSIS — R0609 Other forms of dyspnea: Secondary | ICD-10-CM | POA: Insufficient documentation

## 2011-06-09 DIAGNOSIS — Z992 Dependence on renal dialysis: Secondary | ICD-10-CM | POA: Insufficient documentation

## 2011-06-09 DIAGNOSIS — Z9889 Other specified postprocedural states: Secondary | ICD-10-CM | POA: Insufficient documentation

## 2011-06-09 DIAGNOSIS — R071 Chest pain on breathing: Secondary | ICD-10-CM | POA: Insufficient documentation

## 2011-06-09 DIAGNOSIS — K219 Gastro-esophageal reflux disease without esophagitis: Secondary | ICD-10-CM | POA: Insufficient documentation

## 2011-06-09 DIAGNOSIS — R0602 Shortness of breath: Secondary | ICD-10-CM | POA: Insufficient documentation

## 2011-06-09 DIAGNOSIS — I120 Hypertensive chronic kidney disease with stage 5 chronic kidney disease or end stage renal disease: Secondary | ICD-10-CM | POA: Insufficient documentation

## 2011-06-09 NOTE — Telephone Encounter (Signed)
No message left

## 2011-06-10 LAB — CBC
Hemoglobin: 10.6 g/dL — ABNORMAL LOW (ref 13.0–17.0)
Platelets: 346 10*3/uL (ref 150–400)
RBC: 3.48 MIL/uL — ABNORMAL LOW (ref 4.22–5.81)
WBC: 9.8 10*3/uL (ref 4.0–10.5)

## 2011-06-10 LAB — DIFFERENTIAL
Basophils Relative: 1 % (ref 0–1)
Eosinophils Absolute: 0.3 10*3/uL (ref 0.0–0.7)
Monocytes Relative: 9 % (ref 3–12)
Neutro Abs: 6.6 10*3/uL (ref 1.7–7.7)
Neutrophils Relative %: 68 % (ref 43–77)

## 2011-06-10 LAB — BASIC METABOLIC PANEL
CO2: 27 mEq/L (ref 19–32)
Chloride: 89 mEq/L — ABNORMAL LOW (ref 96–112)
Glucose, Bld: 75 mg/dL (ref 70–99)
Potassium: 4.6 mEq/L (ref 3.5–5.1)
Sodium: 130 mEq/L — ABNORMAL LOW (ref 135–145)

## 2011-06-12 ENCOUNTER — Other Ambulatory Visit: Payer: Self-pay | Admitting: Thoracic Surgery

## 2011-06-12 ENCOUNTER — Inpatient Hospital Stay (HOSPITAL_COMMUNITY)
Admission: RE | Admit: 2011-06-12 | Discharge: 2011-06-15 | DRG: 163 | Discharge status: Against Medical Advice | Payer: Medicare Other | Source: Ambulatory Visit | Attending: Thoracic Surgery | Admitting: Thoracic Surgery

## 2011-06-12 ENCOUNTER — Inpatient Hospital Stay (HOSPITAL_COMMUNITY): Payer: Medicare Other

## 2011-06-12 DIAGNOSIS — Z7982 Long term (current) use of aspirin: Secondary | ICD-10-CM

## 2011-06-12 DIAGNOSIS — F17200 Nicotine dependence, unspecified, uncomplicated: Secondary | ICD-10-CM | POA: Diagnosis present

## 2011-06-12 DIAGNOSIS — F1010 Alcohol abuse, uncomplicated: Secondary | ICD-10-CM | POA: Diagnosis present

## 2011-06-12 DIAGNOSIS — F1910 Other psychoactive substance abuse, uncomplicated: Secondary | ICD-10-CM | POA: Diagnosis present

## 2011-06-12 DIAGNOSIS — J90 Pleural effusion, not elsewhere classified: Secondary | ICD-10-CM

## 2011-06-12 DIAGNOSIS — E871 Hypo-osmolality and hyponatremia: Secondary | ICD-10-CM | POA: Diagnosis not present

## 2011-06-12 DIAGNOSIS — R7309 Other abnormal glucose: Secondary | ICD-10-CM | POA: Diagnosis present

## 2011-06-12 DIAGNOSIS — I2510 Atherosclerotic heart disease of native coronary artery without angina pectoris: Secondary | ICD-10-CM | POA: Diagnosis present

## 2011-06-12 DIAGNOSIS — N186 End stage renal disease: Secondary | ICD-10-CM | POA: Diagnosis present

## 2011-06-12 DIAGNOSIS — I120 Hypertensive chronic kidney disease with stage 5 chronic kidney disease or end stage renal disease: Secondary | ICD-10-CM | POA: Diagnosis present

## 2011-06-12 DIAGNOSIS — R509 Fever, unspecified: Secondary | ICD-10-CM | POA: Diagnosis not present

## 2011-06-12 DIAGNOSIS — B1920 Unspecified viral hepatitis C without hepatic coma: Secondary | ICD-10-CM | POA: Diagnosis present

## 2011-06-12 DIAGNOSIS — K219 Gastro-esophageal reflux disease without esophagitis: Secondary | ICD-10-CM | POA: Diagnosis present

## 2011-06-12 DIAGNOSIS — E785 Hyperlipidemia, unspecified: Secondary | ICD-10-CM | POA: Diagnosis present

## 2011-06-12 DIAGNOSIS — N2581 Secondary hyperparathyroidism of renal origin: Secondary | ICD-10-CM | POA: Diagnosis present

## 2011-06-12 DIAGNOSIS — J4489 Other specified chronic obstructive pulmonary disease: Secondary | ICD-10-CM | POA: Diagnosis present

## 2011-06-12 DIAGNOSIS — F329 Major depressive disorder, single episode, unspecified: Secondary | ICD-10-CM | POA: Diagnosis present

## 2011-06-12 DIAGNOSIS — F3289 Other specified depressive episodes: Secondary | ICD-10-CM | POA: Diagnosis present

## 2011-06-12 DIAGNOSIS — H9190 Unspecified hearing loss, unspecified ear: Secondary | ICD-10-CM | POA: Diagnosis present

## 2011-06-12 DIAGNOSIS — D638 Anemia in other chronic diseases classified elsewhere: Secondary | ICD-10-CM | POA: Diagnosis present

## 2011-06-12 DIAGNOSIS — J449 Chronic obstructive pulmonary disease, unspecified: Secondary | ICD-10-CM | POA: Diagnosis present

## 2011-06-12 DIAGNOSIS — D62 Acute posthemorrhagic anemia: Secondary | ICD-10-CM | POA: Diagnosis not present

## 2011-06-12 HISTORY — PX: OTHER SURGICAL HISTORY: SHX169

## 2011-06-12 LAB — COMPREHENSIVE METABOLIC PANEL
AST: 22 U/L (ref 0–37)
Albumin: 2.9 g/dL — ABNORMAL LOW (ref 3.5–5.2)
Calcium: 8.8 mg/dL (ref 8.4–10.5)
Creatinine, Ser: 7.71 mg/dL — ABNORMAL HIGH (ref 0.50–1.35)
GFR calc non Af Amer: 8 mL/min — ABNORMAL LOW (ref >60)

## 2011-06-12 LAB — CBC
HCT: 31.3 % — ABNORMAL LOW (ref 39.0–52.0)
MCHC: 34.5 g/dL (ref 30.0–36.0)
RDW: 14.3 % (ref 11.5–15.5)

## 2011-06-12 LAB — BLOOD GAS, ARTERIAL
Acid-Base Excess: 0.1 mmol/L (ref 0.0–2.0)
Patient temperature: 98.6
TCO2: 24.8 mmol/L (ref 0–100)
pH, Arterial: 7.443 (ref 7.350–7.450)

## 2011-06-12 LAB — APTT: aPTT: 44 seconds — ABNORMAL HIGH (ref 24–37)

## 2011-06-12 LAB — URINALYSIS, ROUTINE W REFLEX MICROSCOPIC
Bilirubin Urine: NEGATIVE
Glucose, UA: 100 mg/dL — AB
Ketones, ur: NEGATIVE mg/dL
pH: 8 (ref 5.0–8.0)

## 2011-06-12 LAB — GLUCOSE, CAPILLARY

## 2011-06-12 LAB — URINE MICROSCOPIC-ADD ON

## 2011-06-12 LAB — PROTIME-INR: INR: 1.24 (ref 0.00–1.49)

## 2011-06-12 LAB — SURGICAL PCR SCREEN: Staphylococcus aureus: POSITIVE — AB

## 2011-06-12 NOTE — H&P (Signed)
  NAMESUDEEP, SCHEIBEL NO.:  [id number]  MEDICAL RECORD NO.:  [id number]  LOCATION:  [facility]                         FACILITY:  [facility]  PHYSICIAN:  Ines Bloomer, M.D. DATE OF BIRTH:  11-Jul-1961  DATE OF ADMISSION:  05/26/2011 DATE OF DISCHARGE:                             HISTORY & PHYSICAL   CHIEF COMPLAINT:  Right pleural effusion.  This 50 year old African American male has end-stage renal disease and has left upper arm cephalic fistula.  He had a recent extravasation there.  He has had for at least a 3-4 months history of right pleural effusion and thoracentesis released 200 mL which were nondiagnostic.  He has had no fever or chills.  The pleural effusion, he says causing pain and he takes Vicodin for this.  He has fistula inserted at [facility].  He is being admitted for the drainage of his pleural effusion.  He takes aspirin, amantadine, Coumadin, folic acid, selenium, Lasix, Vicodin, Prilosec, Spiriva, and Prevacid.  Dr. Kristian Covey is his nephrologist.  He is allergic to ENTERIC-COATED ASPIRIN, MORPHINE, NONSTEROIDALS AND CODEINE.  He also has been treated for chronic obstructive pulmonary disease and hypertension.  Family history is noncontributory.  SOCIAL HISTORY:  He has 1 child.  Smokes one pack a day.  Does not drink alcohol on a regular basis.  REVIEW OF SYSTEMS:  His weight is stable.  CARDIAC:  No recent angina or atrial fibrillation.  PULMONARY:  No hemoptysis, has a cough.  GI:  He has had peptic ulcer disease and reflux.  GU:  Kidney disease, see history of present illness.  VASCULAR:  See history of present illness. No claudication, DVT, TIAs.  NEUROLOGIC:  No dizziness, headaches, blackouts, seizures.  MUSCULOSKELETAL:  Arthritis.  PSYCHIATRIC:  No depression or nervousness.  ENT:  No changes in eyesight or hearing. HEMATOLOGICAL:  No problems with bleeding, clotting disorders or anemia.  PHYSICAL EXAMINATION:  GENERAL:  He is a  well-developed male in no acute distress. VITAL SIGNS:  His blood pressure is 147/88, pulses 84, respirations 18, sats were 96%. HEENT:  Atraumatic.  Eyes: Pupils equal and reactive to light and accommodation.  Extraocular movements normal.  Ears: Tympanic membranes are intact.  Nose: There is no septal deviation. NECK:  Supple without thyromegaly.  There is no supraclavicular or axillary adenopathy. CHEST:  Clear to auscultation and percussion. HEART:  Regular sinus rhythm.  No murmurs. ABDOMEN:  Soft.  No hepatosplenomegaly. EXTREMITY:  He has several and dialysis scars and a left upper arm fistula.  He has no clubbing or edema. NEUROLOGICAL:  He is oriented x3.  Sensory and motor intact.  Cranial nerves intact.  IMPRESSION: 1. Recurrent right pleural effusion, rule out cancer, rule out     inflammation. 2. End-stage renal disease. 3. Hypertension.  DICTATION ENDS AT THIS POINT.     Ines Bloomer, M.D.     DPB/MEDQ  D:  06/07/2011  T:  06/08/2011  Job:  [id number]  Electronically Signed by Jovita Gamma M.D. on 06/12/2011 04:08:34 PM

## 2011-06-12 NOTE — H&P (Signed)
  NAMEARCHIBALD, MARCHETTA NO.:  [id number]  MEDICAL RECORD NO.:  [id number]  LOCATION:  [facility]                         FACILITY:  [facility]  PHYSICIAN:  Ines Bloomer, M.D. DATE OF BIRTH:  [birth year]  DATE OF ADMISSION:  06/12/2011 DATE OF DISCHARGE:                             HISTORY & PHYSICAL   CHIEF COMPLAINT:  Shortness of breath.  HISTORY OF PRESENT ILLNESS:  This is a 50 year old Caucasian male who has end-stage renal disease and had a left fistula that was done at [facility].  He has had a several month history of a right pleural effusion and had a thoracentesis that was nondiagnostic and was sent for evaluation of right pleural effusion.  He has no fever, chills, or excessive sputum.  He said he has pain from pleural effusion, it has not changed since his dialysis.  MEDICATIONS:  Aspirin, amantadine, folic acid, selenium, Lasix, Vicodin, Prilosec, Spiriva, and Prevacid.  He dialyzes Tuesday, Thursday, and Saturday.  He is allergic to ENTERIC COATED ASPIRIN, MORPHINE, NONSTEROIDALS, and CODEINE.  DIAGNOSES: 1. Chronic obstructive pulmonary disease. 2. Coronary artery disease. 3. Hypertension.  FAMILY HISTORY:  Noncontributory.  SOCIAL HISTORY:  He lives with his child.  Smokes a pack of cigarettes a day.  Does not drink alcohol on a regular basis.  REVIEW OF SYSTEMS:  His weight is stable.  CARDIAC:  No recent angina or atrial fibrillation.  PULMONARY:  No hemoptysis.  He has cough.  GI:  He has had peptic ulcer disease and reflux.  GU:  See history of present illness.  VASCULAR:  No claudication, DVT, or TIAs.  NEUROLOGICAL:  No dizziness, headaches, blackouts, or seizures.  MUSCULOSKELETAL:  No arthritis.  PSYCHIATRIC:  No depression or nervousness.  EYE/ENT: No changes in eyesight or hearing.  HEMATOLOGICAL:  No problems with bleeding, clotting disorders, but does have anemia.  PHYSICAL EXAMINATION:  GENERAL:  He is a well-developed  Caucasian male in no acute distress. VITAL SIGNS:  His blood pressure is 147/88, pulse 88, respirations 18, sats were 96%. HEAD:  Atraumatic. EYES:  Pupils equal, round, and reactive to light and accommodation. Extraocular movements normal. EARS:  Tympanic membranes intact. NOSE:  There is no septal deviation. NECK:  Supple without thyromegaly.  No supraclavicular or axillary adenopathy. CHEST:  Clear on the left, decreased breath sounds on the right. HEART:  Regular sinus rhythm.  No murmurs. ABDOMEN:  Soft.  Bowel sounds are normal. EXTREMITIES:  Pulses are 2+.  There is a left upper arm cephalic fistula. NEUROLOGICAL:  He is oriented x3.  Motor intact.  IMPRESSION: 1. Right pleural effusion, rule out malignant pleural effusion, rule     out inflammatory pleural effusion, rule out congestive failure. 2. End-stage renal disease. 3. Hypertension. 4. Chronic obstructive pulmonary disease. 5. Dyslipidemia. 6. Tobacco abuse.  PLAN:  Right VATS, drainage of pleural effusion, pleural biopsies, and talc pleurodesis.     Ines Bloomer, M.D.     DPB/MEDQ  D:  06/07/2011  T:  06/08/2011  Job:  [id number]  Electronically Signed by Jovita Gamma M.D. on 06/12/2011 04:08:37 PM

## 2011-06-13 ENCOUNTER — Inpatient Hospital Stay (HOSPITAL_COMMUNITY): Payer: Medicare Other

## 2011-06-13 LAB — CBC
HCT: 17.2 % — ABNORMAL LOW (ref 39.0–52.0)
Hemoglobin: 6.3 g/dL — CL (ref 13.0–17.0)
MCHC: 36.6 g/dL — ABNORMAL HIGH (ref 30.0–36.0)
Platelets: 280 10*3/uL (ref 150–400)
RBC: 2.01 MIL/uL — ABNORMAL LOW (ref 4.22–5.81)
RBC: 2.94 MIL/uL — ABNORMAL LOW (ref 4.22–5.81)
RDW: 14.3 % (ref 11.5–15.5)
WBC: 12.6 10*3/uL — ABNORMAL HIGH (ref 4.0–10.5)
WBC: 11.3 10*3/uL — ABNORMAL HIGH (ref 4.0–10.5)

## 2011-06-13 LAB — POCT I-STAT 3, ART BLOOD GAS (G3+)
Acid-base deficit: 1 mmol/L (ref 0.0–2.0)
Bicarbonate: 23 mEq/L (ref 20.0–24.0)
pCO2 arterial: 37.8 mmHg (ref 35.0–45.0)
pO2, Arterial: 117 mmHg — ABNORMAL HIGH (ref 80.0–100.0)

## 2011-06-13 LAB — BASIC METABOLIC PANEL
BUN: 32 mg/dL — ABNORMAL HIGH (ref 6–23)
CO2: 24 mEq/L (ref 19–32)
Chloride: 85 mEq/L — ABNORMAL LOW (ref 96–112)
Glucose, Bld: 117 mg/dL — ABNORMAL HIGH (ref 70–99)
Potassium: 5.7 mEq/L — ABNORMAL HIGH (ref 3.5–5.1)

## 2011-06-13 NOTE — Consult Note (Signed)
[name], [name] NO.:  [id number]  MEDICAL RECORD NO.:  [id number]  LOCATION:  2303                         FACILITY:  [facility]  PHYSICIAN:  Zetta Bills, MD          DATE OF BIRTH:  1961-06-30  DATE OF CONSULTATION:  06/12/2011 DATE OF DISCHARGE:                                CONSULTATION   Nephrology has consulted by Dr. Jovita [name] of Thoracic Surgery for the continuity of [name]' ESRD care.  HISTORY OF PRESENT ILLNESS:  [name] is a 50 year old Caucasian man with past medical history significant for end-stage renal disease on hemodialysis on a Tuesday, Thursday, Saturday schedule at [facility] in [place], [place].  The etiology of his ESRD is thought to be due to hypertension and prior history of polysubstance abuse.  He was admitted to the hospital today for VATS in order to assist with management of recurrent right-sided pleural effusion.  Today, he underwent video-assisted thoracoscopy and had drainage of his pleural effusion, talc pleurodesis, and placement of a PleurX catheter.  In his postoperative state, other than pain and some bleeding from the exit site of his catheter, he complains of some shortness of breath.  He denies any nausea or vomiting.  PAST MEDICAL HISTORY: 1. ESRD on hemodialysis TT as schedule. 2. Hypertension. 3. Right-sided pleural effusion recurrent. 4. Chronic obstructive lung disease with ongoing tobacco use. 5. Coronary artery disease. 6. Polysubstance abuse including alcohol and cocaine. 7. Hepatitis C. 8. Gastroesophageal reflux disease. 9. Depression.  MEDICATIONS: 1. PhosLo 3 tablets t.i.d. a.c. 2. Hectorol 1 mcg every dialysis Tuesday and Thursday, Saturday. 3. Venofer 100 mg every weekly. 4. Amlodipine 10 mg daily. 5. Clonidine 0.1 mg b.i.d. p.r.n. 6. ProAir inhaler q.4 h. p.r.n. 7. Dialyvite daily. 8. Spiriva b.i.d. 9. Furosemide 20 mg daily 10.Trazodone 50 mg nightly. 11.Omeprazole 20 mg  daily 12.Vicodin p.r.n.  ALLERGIES: 1. Intolerant to ASPIRIN. 2. CODEINE causes skin hives.  SOCIAL HISTORY:  The patient is married and resides at home with his wife of 18 years.  He has one daughter whom is alive and well. Apparently, smokes half a pack of cigarettes and claims that this is down from two packs a day.  Denies any alcohol use, however, this is noted from the past.  FAMILY HISTORY:  Mother died with complications of AAA.  Father died of unknown cause.  REVIEW OF SYSTEMS:  GENERAL:  Denies any fevers.  Reports some chills, anorexia and malaise.  HEAD, NECK and ENT SYSTEMS:  Denies any photophobia, dysphagia, odynophagia, tinnitus or epistaxis. CARDIOVASCULAR:  Reports some chest pain.  Reports occasional palpitations.  Denies claudication and has pedal edema.  RESPIRATORY: Denies any cough, however, has occasional sputum production in the morning.  Denies hemoptysis.  See HPI above.  GI:  Denies any nausea, vomiting, diarrhea, hematochezia, melena or hematemesis.  GU:  Denies any dysuria, urgency, frequency, incontinence or retention. MUSCULOSKELETAL:  Pain all over.  PHYSICAL EXAMINATION:  VITAL SIGNS:  Reveal a temperature of 97.8 degrees Fahrenheit, pulse of 76, blood pressure 132/71, respiratory rate of 10-14 per minute, oxygen saturation 94-100% on 2 liters via nasal cannula. GENERAL EVALUATION:  Poorly kempt young Caucasian man in fair condition appears uncomfortable, resting in bed. HEAD, NECK AND ENT SYSTEMS:  Head is normocephalic and atraumatic. Pupils are bilaterally equal and reactive to light.  Extraocular muscle movements are normal.  NECK:  Supple without any obvious JVD or goiter. No lymphadenopathy. CARDIOVASCULAR ASSESSMENT:  Pulses regular in rate and rhythm.  Heart sounds S1 and S2 normal.  No murmurs, rubs or gallops. RESPIRATORY EVALUATION:  Distant breath sounds left lung field.  Right lung field with pleural rub. ABDOMEN:  Soft, flat,  nontender without any organomegaly and bowel sounds are normal. EXTREMITIES:  Reveal 1-2+ pitting edema bilaterally.  LABORATORY DATA:  Sodium 122, potassium 4.5, bicarbonate 23, BUN 23, creatinine 7.7, albumin 2.9, calcium 8.8, hemoglobin 10.8, hematocrit 31.3, platelet count 320,000, white cell count 9700.  ASSESSMENT AND PLAN: 1. End-stage renal disease on hemodialysis Tuesday, Thursday and     Saturday schedule.  I will continue his hemodialysis with his next     treatment tomorrow with his usual outpatient prescription given its     adequacy seen on previous lab numbers.  We will increase his blood     flow rate to 400 mL per minute as I feel that his fistula is much     sure and adequate enough to sustain the source of blood flow and     confer him a better clearance.  Volume status today appears a     little on the higher side and electrolytes are satisfactory as to     not warrant urgent hemodialysis. 2. Hypertension.  Blood pressure appears to be fairly controlled, we     will continue his usual outpatient medications. 3. Recurrent right-sided pleural effusion, status post VATS with     drainage, talc pleurodesis, and PleurX catheter.  Furthermanagement per Dr. Edwyna Shell. 4. Secondary hyperparathyroidism.  He is currently on PhosLo and     Hectorol.  We will continue him on the Zemplar while here in the     hospital. 5. Anemia of chronic disease.  On IV Venofer, continue this for now     and resume ESA upon discharge.     Zetta Bills, MD     JP/MEDQ  D:  06/12/2011  T:  06/13/2011  Job:  [id number]  cc:   Jorja Loa, M.D.  Electronically Signed by Zetta Bills MD on 06/13/2011 03:12:36 PM

## 2011-06-14 ENCOUNTER — Inpatient Hospital Stay (HOSPITAL_COMMUNITY): Payer: Medicare Other

## 2011-06-14 LAB — CBC
HCT: 22.3 % — ABNORMAL LOW (ref 39.0–52.0)
MCV: 87.1 fL (ref 78.0–100.0)
Platelets: 303 10*3/uL (ref 150–400)
RBC: 2.56 MIL/uL — ABNORMAL LOW (ref 4.22–5.81)
RDW: 14.6 % (ref 11.5–15.5)
WBC: 14.6 10*3/uL — ABNORMAL HIGH (ref 4.0–10.5)

## 2011-06-14 LAB — TYPE AND SCREEN: Antibody Screen: NEGATIVE

## 2011-06-14 LAB — CULTURE, RESPIRATORY W GRAM STAIN: Culture: NO GROWTH

## 2011-06-14 LAB — COMPREHENSIVE METABOLIC PANEL
AST: 23 U/L (ref 0–37)
Albumin: 2.8 g/dL — ABNORMAL LOW (ref 3.5–5.2)
Alkaline Phosphatase: 139 U/L — ABNORMAL HIGH (ref 39–117)
CO2: 26 mEq/L (ref 19–32)
Chloride: 87 mEq/L — ABNORMAL LOW (ref 96–112)
Creatinine, Ser: 6.23 mg/dL — ABNORMAL HIGH (ref 0.50–1.35)
GFR calc non Af Amer: 10 mL/min — ABNORMAL LOW (ref >60)
Potassium: 4.6 mEq/L (ref 3.5–5.1)
Total Bilirubin: 0.5 mg/dL (ref 0.3–1.2)

## 2011-06-15 ENCOUNTER — Inpatient Hospital Stay (HOSPITAL_COMMUNITY): Payer: Medicare Other

## 2011-06-15 LAB — TYPE AND SCREEN

## 2011-06-19 NOTE — Op Note (Signed)
  NAMEVINICIO, LYNK NO.:  [id number]  MEDICAL RECORD NO.:  [id number]  LOCATION:  2303                         FACILITY:  [facility]  PHYSICIAN:  Ines Bloomer, M.D. DATE OF BIRTH:  [birth year]  DATE OF PROCEDURE: DATE OF DISCHARGE:                              OPERATIVE REPORT   PREOPERATIVE DIAGNOSIS:  Refer recurrent right pleural effusion.  POSTOPERATIVE DIAGNOSIS:  Refer recurrent right pleural effusion, possible inflammatory.  OPERATION PERFORMED:  Right VATS, drainage of pleural effusion, pleural biopsy, talc pleurodesis, insertion of PleurX catheter, right PleurX catheter.  SURGEON:  Ines Bloomer, MD  FIRST ASSISTANT:  Coral Ceo, PA-C.  ANESTHESIA:  General anesthesia.  DESCRIPTION OF PROCEDURE:  After percutaneous insertion of all monitoring lines, the patient underwent general anesthesia, right subclavian line was inserted.  The patient had a left upper arm fistula and was on chronic renal dialysis, was brought to the operating room for a chronic right pleural effusion.  After general anesthesia and insertion of dual-lumen tube, the right lung was deflated.  Trocar site was made in the eighth intercostal space at the anterior axillary line. The trocar was inserted.  We drained 1500 mL of fluid.  We then made an another small trocar site at the ninth intercostal space at the posterior axillary line; and through that, we inserted a biopsy forceps to biopsy the pleura and to take the pictures of this multiple.  It was sent for frozen section which revealed a fibrotic process with chronic inflammation.  We then did multiple more biopsies for permanent section. We inserted 3-5 g of talc with insufflation, and through the posterior trocar site inserted a PleurX catheter under direct vision after tunneling from a stab wound at the eighth intercostal space of the anterior axillary line.  The PleurX catheter was inserted and sutured in place  with 3-0 silk at the exit site and passed through the posterior trocar site.  A #36 chest tube was inserted through the mid trocar sites and sutured in place with 0 silk.  A Marcaine block was done in the usual fashion.  The patient turned to recovery room in stable condition.     Ines Bloomer, M.D.     DPB/MEDQ  D:  06/12/2011  T:  06/13/2011  Job:  [id number]  Electronically Signed by Jovita Gamma M.D. on 06/19/2011 01:55:17 PM

## 2011-06-19 NOTE — Discharge Summary (Signed)
[name], [name] NO.:  [id number]  MEDICAL RECORD NO.:  [id number]  LOCATION:  3309                         FACILITY:  [facility]  PHYSICIAN:  Ines Bloomer, M.D. DATE OF BIRTH:  March 08, 1961  DATE OF ADMISSION:  06/12/2011 DATE OF DISCHARGE:  06/16/2011                              DISCHARGE SUMMARY   ADMITTING DIAGNOSES: 1. Recurrent right pleural effusions. 2. History of chronic obstructive pulmonary disease. 3. History of coronary arteries. 4. History of hypertension. 5. History of end-stage renal disease (dialyzes to Thursday and     Saturday). 6. History of hypertension. 7. History of polysubstance abuse (including alcohol). 8. History of hepatitis C. 9. History of gastroesophageal reflux disease. 10.History of borderline diabetes mellitus.  DISCHARGE DIAGNOSES: 1. Recurrent right pleural effusions. 2. History of chronic obstructive pulmonary disease. 3. History of coronary arteries. 4. History of hypertension. 5. History of end-stage renal disease (dialyzes to Thursday and     Saturday). 6. History of hypertension. 7. History of polysubstance abuse (including alcohol). 8. History of hepatitis C. 9. History of gastroesophageal reflux disease. 10.History of borderline diabetes mellitus.  PROCEDURE:  Right VATS, drainage of right pleural effusion, pleural biopsy, talc pleurodesis, and insertion of right PleurX catheter by Dr. Edwyna Shell on June 12, 2011.  Pathology results show the right pleura clear to be densely fibrotic pleural plaque with mixed acute and chronic inflammation, negative for malignancy.  Cytology of the right pleural fluid showed lymphocytes and reactive mesothelial cells.  Again, no malignancy.  HISTORY OF PRESENT ILLNESS:  This is a 50 year old Caucasian male with the aforementioned past medical history who was initially seen in the office by Dr. Edwyna Shell in consultation on May 24, 2011 for a recurrent right pleural effusion.   According to medical records, the patient has had thoracenteses, which apparently the cytology was non-diagnostic. Unfortunately, he continues to have recurrent right pleural effusion and does have some pain secondary to this.  Dr. Edwyna Shell had a discussion with the patient regarding the necessitation for right VATS, drainage of right pleural effusion and insertion of a right PleurX catheter.  Risks, complications and benefits of the surgery were discussed with the patient and he agreed to proceed.  He was admitted to [facility] on June 12, 2011, in order to undergo the right VATS, drainage of right pleural effusion, pleural biopsies, talc pleurodesis, and insertion of right PleurX catheter.  BRIEF HOSPITAL COURSE STAY:  The patient has remained afebrile and vital signs stable.   His A-line was removed on postop day #1He was found to be anemic.  On postoperative day #1, his H and H was 6.3 and 17.2.  As a result, he was given 2 units of packed red blood cells.  Followup H and H was then 8.9 and 25.3.  Last H and H was 7.8 and 22.3.  He will be given another 2 units of packed red blood cells on dialysis today.  A Renal consult was obtained to assist with his postoperative care.  He has undergone hemodialysis on Tuesday and  will have it again today.  Regarding the patient's chest tube postoperatively, he did not have an air leak and had decreasing  drainage.  As a result, it was placed to water seal on June 14, 2011. His chest x-ray remained stable and his chest tube will be removed today.  His central line has been previously removed as well.  He was felt surgically stable for transfer from the intensive care unit to 3200 for further convalescence on June 14, 2011.  He has already been tolerating a diet and has had a bowel movement.       It should be noted that the patient was originally scheduled for hemodialysis in the morning, but the patient wanted to do it later after he had eaten  breakfast. According to the nurse, there was also an incident regarding possible smoking in the patient's bathroom and security was called per protocol.  As a result, the patient became very angry over this as well as not having had dialysis yet.  He wanted to leave AMA. Dr. Edwyna Shell talked to the patient and he agreed to stay for dialysis, but wanted to be discharged afterward.  I spoke with the patient several times.  I did instruct him to bring a kit and at least 2 bottles to have his right pleurex catheter drained at his office appointment on Wednesday.   The last  time I spoke with him was while he was undergoing hemodialysis.  The patient said the transportation company was arriving at 3 to take him home. I explained that he should finish dialysis, have his labs drawn, and then they could transport him home.  He was adamant about leaving around 3.  The patient refused to have his labs drawn and despite potential risks, left AMA late in  the afternoon.   LATEST LABORATORY STUDIES:  Final respiratory culture showed no organisms, no growth.  Last CMP done on July 4, potassium 4.6, sodium 126, BUN and creatinine 20 and 6.23 respectively.  CBC on this date, H and H 7.8 and 22.3, white count of 14,600, and platelet count 303,000. Last chest x-ray done on June 15, 2011 shows no pneumothorax, low lung volume, diffuse right lung airspace disease.  DISCHARGE INSTRUCTIONS: 1. Diet:  Low-sodium, heart-healthy, renal, low-sugar diet. 2. Activity:  The patient may walk up steps.  He may shower.  He is     not to lift more than 10 pounds for 2 weeks.  He is not to drive     until after 4 weeks.  He is to continue with his breathing exercise     daily, he is to walk daily, and increase his friction duration as     tolerates. 3. Wound care.  He is to use soap and water on his wounds.  He is to     contact the office if any wound problems arise.  FOLLOWUP APPOINTMENTS: 1. The patient is going to  have an appointment and need to see Dr.     Edwyna Shell next week and 45 minutes prior to this office appointment,     chest x-ray will be obtained. 2. The patient is to continue his current dialysis days Tuesday,     Thursday and Saturday and follow up with his nephrologist     accordingly. 3.  Home health will be arranged if the patient needs to have his    right pleurex catheter drained several times per week.  He will    first be drained at his office appointment with Dr. Edwyna Shell.  DISCHARGE MEDICATIONS AT TIME OF DICTATION: 1. Nutritional supplement to slowly impair renal function 237  mL p.o.     3 times daily as needed. 2. Percocet 5/325 one-two tablets p.o. q.4-6 hours p.o. p.r.n. pain. 3. Enteric-coated aspirin 81 mg p.o. daily. 4. Advair Diskus 250/50 one puff inhaled twice daily. 5. Amlodipine 10 mg p.o. daily. 6. Calcium acetate 667 mg 1-3 capsules p.o. 3 times daily with meals. 7. Clonidine 0.1 mg p.o. 2 times daily. 8. Nicotine patch 21 mg per 24 hours, one patch to skin daily. 9. Omeprazole 20 mg p.o. daily. 10.Renal vitamin p.o. every Tuesday, Thursday and Saturday. 11.Spiriva 18 mcg, inhale daily. 12.Trazodone 50 mg p.o. at bedtime p.r.n. sleep.     Doree Fudge, PA   ______________________________ Ines Bloomer, M.D.    DZ/MEDQ  D:  06/15/2011  T:  06/15/2011  Job:  [id number]  cc:   Ines Bloomer, M.D. Jonathon Bellows, MD [facility]  Electronically Signed by Doree Fudge PA on 06/16/2011 11:06:48 AM Electronically Signed by Jovita Gamma M.D. on 06/19/2011 01:55:20 PM

## 2011-06-20 ENCOUNTER — Other Ambulatory Visit: Payer: Self-pay | Admitting: Thoracic Surgery

## 2011-06-20 DIAGNOSIS — J90 Pleural effusion, not elsewhere classified: Secondary | ICD-10-CM

## 2011-06-21 ENCOUNTER — Ambulatory Visit (INDEPENDENT_AMBULATORY_CARE_PROVIDER_SITE_OTHER): Payer: Medicare Other | Admitting: Thoracic Surgery

## 2011-06-21 ENCOUNTER — Ambulatory Visit
Admission: RE | Admit: 2011-06-21 | Discharge: 2011-06-21 | Disposition: A | Discharge status: Home / Self Care | Payer: Medicare Other | Source: Ambulatory Visit | Attending: Thoracic Surgery | Admitting: Thoracic Surgery

## 2011-06-21 DIAGNOSIS — J90 Pleural effusion, not elsewhere classified: Secondary | ICD-10-CM

## 2011-06-21 NOTE — Assessment & Plan Note (Signed)
OFFICE VISIT  MAR, WALMER DOB:  12-May-1961                                        June 21, 2011 CHART #:  [id number]  The patient returned today and his chest x-ray still showed opacities in his right chest with some small air fluid levels, I think he has probably loculated pleural effusion with some probable hematoma and there we tried to drain his PleurX and really got minimal drainage.  We will continue his Tuesday, Thursday, Saturday dialysis.  He has no evidence of infection now.  His blood pressure is 133/75, pulse 90, respirations 100.  I will see him back in 1 week with a chest x-ray.  I will remove the PleurX next time, if it does not drain anything else. I removed his chest tube sutures today.  Ines Bloomer, M.D. Electronically Signed  DPB/MEDQ  D:  06/21/2011  T:  06/21/2011  Job:  [id number]

## 2011-06-27 ENCOUNTER — Other Ambulatory Visit: Payer: Self-pay | Admitting: Thoracic Surgery

## 2011-06-27 DIAGNOSIS — J90 Pleural effusion, not elsewhere classified: Secondary | ICD-10-CM

## 2011-06-28 ENCOUNTER — Ambulatory Visit
Admission: RE | Admit: 2011-06-28 | Discharge: 2011-06-28 | Disposition: A | Discharge status: Home / Self Care | Payer: Medicare Other | Source: Ambulatory Visit | Attending: Thoracic Surgery | Admitting: Thoracic Surgery

## 2011-06-28 ENCOUNTER — Ambulatory Visit (INDEPENDENT_AMBULATORY_CARE_PROVIDER_SITE_OTHER): Payer: Self-pay | Admitting: Thoracic Surgery

## 2011-06-28 DIAGNOSIS — J90 Pleural effusion, not elsewhere classified: Secondary | ICD-10-CM

## 2011-07-06 ENCOUNTER — Telehealth: Payer: Self-pay

## 2011-07-06 NOTE — Telephone Encounter (Signed)
Pt unable to pick up rx, would like to try Hydrocodone 7.5/500mg . RX call to [facility] in [place]. #40 no refill.

## 2011-07-07 NOTE — Assessment & Plan Note (Signed)
OFFICE VISIT  [name], [name] DOB:  01-30-1961                                        June 28, 2011 CHART #:  [id number]  The patient came today.  We tried to drain his Pleurx; it did not drain. His chest x-ray was the same.  I think he has had both talc as well as some clotted hemothorax in his right chest.  Hopefully, this will resolve over the next 2-3 weeks.  We cut the sutures and removed his Pleurx without any problem.  I plan to see him back again in 2 weeks with chest x-rays.  His temp was 97.6, his blood pressure was 136/82, pulse 84, respirations 18, sats were 99%.  I gave him prescription for 50 of Percocet.  Ines Bloomer, M.D. Electronically Signed  DPB/MEDQ  D:  06/28/2011  T:  06/29/2011  Job:  [id number]

## 2011-07-10 LAB — FUNGUS CULTURE W SMEAR

## 2011-07-11 ENCOUNTER — Other Ambulatory Visit: Payer: Self-pay | Admitting: Thoracic Surgery

## 2011-07-11 DIAGNOSIS — J90 Pleural effusion, not elsewhere classified: Secondary | ICD-10-CM

## 2011-07-12 ENCOUNTER — Ambulatory Visit
Admission: RE | Admit: 2011-07-12 | Discharge: 2011-07-12 | Disposition: A | Discharge status: Home / Self Care | Payer: Medicare Other | Source: Ambulatory Visit | Attending: Thoracic Surgery | Admitting: Thoracic Surgery

## 2011-07-12 ENCOUNTER — Ambulatory Visit (INDEPENDENT_AMBULATORY_CARE_PROVIDER_SITE_OTHER): Payer: Self-pay | Admitting: Thoracic Surgery

## 2011-07-12 DIAGNOSIS — J90 Pleural effusion, not elsewhere classified: Secondary | ICD-10-CM

## 2011-07-13 NOTE — Assessment & Plan Note (Signed)
OFFICE VISIT  [name], [name] DOB:  10-12-1961                                        July 12, 2011 CHART #:  [id number]  The patient came today.  He has recent onset of some blood in his stools.  He is still on dialysis.  His blood pressure is 155/90, pulse 90, respirations 16, sats were 98%.  Chest x-ray is read as unchanged although I think there is some slight improvement and he is slowly clearing out the right hemothorax, but I think this will take a while. His incision is well healed.  Plan to see him back again in 6 weeks with another chest x-ray.  Ines Bloomer, M.D. Electronically Signed  DPB/MEDQ  D:  07/12/2011  T:  07/13/2011  Job:  [id number]

## 2011-07-25 LAB — ACID-FAST (MYCOBACTERIA) SMEAR AND CULTURE WITH REFLEX TO IDENTIFICATION: Acid Fast Smear: NONE SEEN

## 2011-07-27 ENCOUNTER — Emergency Department (HOSPITAL_COMMUNITY): Payer: Medicare Other

## 2011-07-27 ENCOUNTER — Emergency Department (HOSPITAL_COMMUNITY)
Admission: EM | Admit: 2011-07-27 | Discharge: 2011-07-27 | Disposition: A | Discharge status: Home / Self Care | Payer: Medicare Other | Attending: Emergency Medicine | Admitting: Emergency Medicine

## 2011-07-27 ENCOUNTER — Encounter (HOSPITAL_COMMUNITY): Payer: Self-pay | Admitting: *Deleted

## 2011-07-27 DIAGNOSIS — F17200 Nicotine dependence, unspecified, uncomplicated: Secondary | ICD-10-CM | POA: Insufficient documentation

## 2011-07-27 DIAGNOSIS — I120 Hypertensive chronic kidney disease with stage 5 chronic kidney disease or end stage renal disease: Secondary | ICD-10-CM | POA: Insufficient documentation

## 2011-07-27 DIAGNOSIS — Z992 Dependence on renal dialysis: Secondary | ICD-10-CM | POA: Insufficient documentation

## 2011-07-27 DIAGNOSIS — Z7982 Long term (current) use of aspirin: Secondary | ICD-10-CM | POA: Insufficient documentation

## 2011-07-27 DIAGNOSIS — L02619 Cutaneous abscess of unspecified foot: Secondary | ICD-10-CM | POA: Insufficient documentation

## 2011-07-27 DIAGNOSIS — L03031 Cellulitis of right toe: Secondary | ICD-10-CM

## 2011-07-27 DIAGNOSIS — Z79899 Other long term (current) drug therapy: Secondary | ICD-10-CM | POA: Insufficient documentation

## 2011-07-27 DIAGNOSIS — N186 End stage renal disease: Secondary | ICD-10-CM | POA: Insufficient documentation

## 2011-07-27 DIAGNOSIS — L03039 Cellulitis of unspecified toe: Secondary | ICD-10-CM | POA: Insufficient documentation

## 2011-07-27 DIAGNOSIS — F1021 Alcohol dependence, in remission: Secondary | ICD-10-CM | POA: Insufficient documentation

## 2011-07-27 LAB — CBC
HCT: 33.7 % — ABNORMAL LOW (ref 39.0–52.0)
Hemoglobin: 11.3 g/dL — ABNORMAL LOW (ref 13.0–17.0)
MCH: 30.2 pg (ref 26.0–34.0)
MCHC: 33.5 g/dL (ref 30.0–36.0)

## 2011-07-27 LAB — BASIC METABOLIC PANEL
BUN: 17 mg/dL (ref 6–23)
Creatinine, Ser: 4.75 mg/dL — ABNORMAL HIGH (ref 0.50–1.35)
GFR calc Af Amer: 16 mL/min — ABNORMAL LOW (ref >60)
GFR calc non Af Amer: 13 mL/min — ABNORMAL LOW (ref >60)

## 2011-07-27 LAB — DIFFERENTIAL
Basophils Relative: 0 % (ref 0–1)
Eosinophils Absolute: 0.1 10*3/uL (ref 0.0–0.7)
Monocytes Absolute: 1.1 10*3/uL — ABNORMAL HIGH (ref 0.1–1.0)
Monocytes Relative: 9 % (ref 3–12)
Neutrophils Relative %: 83 % — ABNORMAL HIGH (ref 43–77)

## 2011-07-27 MED ORDER — DOXYCYCLINE HYCLATE 100 MG PO CAPS: 100.0000 mg | ORAL_CAPSULE | Freq: Two times a day (BID) | ORAL | Status: AC

## 2011-07-27 MED ORDER — OXYCODONE-ACETAMINOPHEN 5-325 MG PO TABS
2.0000 | ORAL_TABLET | Freq: Once | ORAL | Status: AC
  Administered 2011-07-27: 2 via ORAL
  Filled 2011-07-27: qty 2

## 2011-07-27 MED ORDER — OXYCODONE-ACETAMINOPHEN 5-325 MG PO TABS: 1.0000 | ORAL_TABLET | ORAL | Status: AC | PRN

## 2011-07-27 MED ORDER — DOXYCYCLINE HYCLATE 100 MG PO TABS
100.0000 mg | ORAL_TABLET | Freq: Once | ORAL | Status: AC
  Administered 2011-07-27: 100 mg via ORAL
  Filled 2011-07-27: qty 1

## 2011-07-27 NOTE — ED Notes (Signed)
C/o pain to 4th and 5th toes on right foot.  Denies injury.  Swelling and redness noted.

## 2011-07-27 NOTE — ED Provider Notes (Signed)
History     CSN: [id number] Arrival date & time: 07/27/2011  4:02 PM  Chief Complaint  Patient presents with  . Toe Pain   HPI Comments: Patient is a 50 year old male with a history of end-stage renal disease on dialysis and dialyzes Tuesday Thursday and Saturday last dialyzed this morning, presents with right fifth toe pain and redness. He states that this started yesterday, was gradual in onset, constant and gradually worsening. It is not associated with fevers, nausea, vomiting, chills. He states that he thought might have been down and took a friend's gout medicine but had no relief. He has a history of right hip surgery and some swelling in his legs but has noticed his right leg swelling more than his left leg recently. He denies chest pain or shortness of breath  Patient is a 50 y.o. male presenting with toe pain. The history is provided by the patient.  Toe Pain This is a new problem. The current episode started yesterday. The problem occurs constantly. The problem has been gradually worsening. Pertinent negatives include no chest pain, no abdominal pain, no headaches and no shortness of breath. Exacerbated by: Ambulation and palpation. Relieved by: Rest. He has tried nothing for the symptoms.    Past Medical History  Diagnosis Date  . ESRD (end stage renal disease)   . Hypertension   . Pleural effusion   . Depression with anxiety   . Tobacco abuse disorder   . SOB (shortness of breath)   . Bruit   . Osteoarthritis   . ETOH abuse   . Homelessness   . H. pylori infection   . Erosive esophagitis     Past Surgical History  Procedure Date  . Total hip arthroplasty 05/2010  . Cholecystectomy   . Knee surgery     rt  . Av fistula placement 2010    Family History  Problem Relation Age of Onset  . Emphysema Father     was a smoker  . Heart disease Mother   . Cerebral aneurysm Brother     History  Substance Use Topics  . Smoking status: Current Everyday Smoker -- 0.5  packs/day for 40 years    Types: Cigarettes  . Smokeless tobacco: Never Used  . Alcohol Use: No      Review of Systems  Respiratory: Negative for shortness of breath.   Cardiovascular: Negative for chest pain.  Gastrointestinal: Negative for abdominal pain.  Neurological: Negative for headaches.  All other systems reviewed and are negative.    Physical Exam  BP 156/77  Pulse 92  Temp(Src) 98.6 F (37 C) (Oral)  Resp 20  Ht 5\' 5"  (1.651 m)  Wt 135 lb (61.236 kg)  BMI 22.47 kg/m2  SpO2 100%  Physical Exam  Nursing note and vitals reviewed. Constitutional: He appears well-developed and well-nourished. No distress.  HENT:  Head: Normocephalic and atraumatic.  Mouth/Throat: Oropharynx is clear and moist. No oropharyngeal exudate.  Eyes: Conjunctivae and EOM are normal. Pupils are equal, round, and reactive to light. Right eye exhibits no discharge. Left eye exhibits no discharge. No scleral icterus.  Neck: Normal range of motion. Neck supple. No JVD present. No thyromegaly present.  Cardiovascular: Normal rate, regular rhythm, normal heart sounds and intact distal pulses.  Exam reveals no gallop and no friction rub.   No murmur heard. Pulmonary/Chest: Effort normal and breath sounds normal. No respiratory distress. He has no wheezes. He has no rales.  Abdominal: Soft. Bowel sounds are normal. He  exhibits no distension and no mass. There is no tenderness.  Musculoskeletal: Normal range of motion. He exhibits edema (right lower extremity 1+ pitting edema from the knee through the foot.) and tenderness (Tender to palpation in the right ankle, right lateral dorsal foot, right fifth toe. Right fifth toe with erythema and mild warmth).  Lymphadenopathy:    He has no cervical adenopathy.  Neurological: He is alert. Coordination normal.  Skin: Skin is warm and dry. Rash noted. There is erythema.       Redness and warmth to the right fifth toe  Psychiatric: He has a normal mood and  affect. His behavior is normal.    ED Course  Procedures  MDM Patient with right foot swelling and tenderness to the right toe. Possibly gout but also entertained infection. We'll obtain x-ray of ultrasound to rule out DVT. Medications given. Evaluation for leukocytosis the  White blood cell count elevated, basic metabolic panel reviewed as per below. X-ray showed no signs of subcutaneous emphysema or bony infection. Antibiotics given and will have followup with primary care provider for recheck. There is no fever, tachycardia or hypotension.  There is also no DVT on [place]  Results for orders placed during the hospital encounter of 07/27/11  CBC      Component Value Range   WBC 13.1 (*) 4.0 - 10.5 (K/uL)   RBC 3.74 (*) 4.22 - 5.81 (MIL/uL)   Hemoglobin 11.3 (*) 13.0 - 17.0 (g/dL)   HCT 16.1 (*) 09.6 - 52.0 (%)   MCV 90.1  78.0 - 100.0 (fL)   MCH 30.2  26.0 - 34.0 (pg)   MCHC 33.5  30.0 - 36.0 (g/dL)   RDW 04.5 (*) 40.9 - 15.5 (%)   Platelets 384  150 - 400 (K/uL)  DIFFERENTIAL      Component Value Range   Neutrophils Relative 83 (*) 43 - 77 (%)   Neutro Abs 10.8 (*) 1.7 - 7.7 (K/uL)   Lymphocytes Relative 8 (*) 12 - 46 (%)   Lymphs Abs 1.1  0.7 - 4.0 (K/uL)   Monocytes Relative 9  3 - 12 (%)   Monocytes Absolute 1.1 (*) 0.1 - 1.0 (K/uL)   Eosinophils Relative 1  0 - 5 (%)   Eosinophils Absolute 0.1  0.0 - 0.7 (K/uL)   Basophils Relative 0  0 - 1 (%)   Basophils Absolute 0.1  0.0 - 0.1 (K/uL)    07/27/2011  *RADIOLOGY REPORT*  Clinical Data: Swelling right lower extremity, right leg pain, question DVT; history of a "rod in the leg" per patient  RIGHT LOWER EXTREMITY VENOUS DUPLEX ULTRASOUND  Technique:  Gray-scale sonography with graded compression, as well as color Doppler and duplex ultrasound, were performed to evaluate the deep venous system of the lower extremity from the level of the common femoral vein through the popliteal and proximal calf veins. Spectral Doppler was utilized  to evaluate flow at rest and with distal augmentation maneuvers.  Comparison:  None  Findings: Deep venous system patent and compressible from right groin through popliteal fossa. Spontaneous venous flow present with intact augmentation. Venous flow in the right lower extremity appears somewhat pulsatile suggesting elevated right heart pressure. No intraluminal thrombus identified. Visualized portion of the greater saphenous system patent. Subcutaneous edema right calf. Soft tissue density identified in right groin likely represents an inguinal lymph node. Incidentally noted thickening of valve leaflets in the right greater saphenous vein.  IMPRESSION: No evidence of deep venous thrombosis in right lower  extremity. Question elevated right heart pressure. Thickened valve leaflets in right greater saphenous vein, suspect sequela of prior superficial thrombophlebitis.  Original Report Authenticated By: Lollie Marrow, M.D.   Dg Foot Complete Right  07/27/2011  *RADIOLOGY REPORT*  Clinical Data: Swelling and redness at the foot and toes  RIGHT FOOT COMPLETE - 3+ VIEW  Comparison: None  Findings: Osseous mineralization normal. Joint spaces preserved. Diffuse soft tissue swelling right foot. No acute fracture, dislocation, or bone destruction.  IMPRESSION: No acute osseous abnormalities.  Original Report Authenticated By: Lollie Marrow, M.D.      Vida Roller, MD 07/27/11 [phone]

## 2011-08-18 ENCOUNTER — Other Ambulatory Visit: Payer: Self-pay | Admitting: Thoracic Surgery

## 2011-08-18 DIAGNOSIS — J90 Pleural effusion, not elsewhere classified: Secondary | ICD-10-CM

## 2011-08-22 ENCOUNTER — Encounter: Payer: Self-pay | Admitting: *Deleted

## 2011-08-22 ENCOUNTER — Ambulatory Visit: Payer: Self-pay | Admitting: Thoracic Surgery

## 2011-08-23 ENCOUNTER — Emergency Department (HOSPITAL_COMMUNITY): Payer: Medicare Other

## 2011-08-23 ENCOUNTER — Encounter (HOSPITAL_COMMUNITY): Payer: Self-pay | Admitting: Emergency Medicine

## 2011-08-23 ENCOUNTER — Ambulatory Visit: Payer: Medicare Other | Admitting: Thoracic Surgery

## 2011-08-23 ENCOUNTER — Emergency Department (HOSPITAL_COMMUNITY)
Admission: EM | Admit: 2011-08-23 | Discharge: 2011-08-23 | Disposition: A | Discharge status: Home / Self Care | Payer: Medicare Other | Attending: Emergency Medicine | Admitting: Emergency Medicine

## 2011-08-23 DIAGNOSIS — R1084 Generalized abdominal pain: Secondary | ICD-10-CM

## 2011-08-23 DIAGNOSIS — F17200 Nicotine dependence, unspecified, uncomplicated: Secondary | ICD-10-CM | POA: Insufficient documentation

## 2011-08-23 DIAGNOSIS — N186 End stage renal disease: Secondary | ICD-10-CM | POA: Insufficient documentation

## 2011-08-23 DIAGNOSIS — Z888 Allergy status to other drugs, medicaments and biological substances status: Secondary | ICD-10-CM | POA: Insufficient documentation

## 2011-08-23 DIAGNOSIS — I120 Hypertensive chronic kidney disease with stage 5 chronic kidney disease or end stage renal disease: Secondary | ICD-10-CM | POA: Insufficient documentation

## 2011-08-23 DIAGNOSIS — Z885 Allergy status to narcotic agent status: Secondary | ICD-10-CM | POA: Insufficient documentation

## 2011-08-23 LAB — DIFFERENTIAL
Basophils Absolute: 0.1 10*3/uL (ref 0.0–0.1)
Basophils Relative: 1 % (ref 0–1)
Monocytes Absolute: 1.2 10*3/uL — ABNORMAL HIGH (ref 0.1–1.0)
Neutro Abs: 7.8 10*3/uL — ABNORMAL HIGH (ref 1.7–7.7)
Neutrophils Relative %: 69 % (ref 43–77)

## 2011-08-23 LAB — CBC
MCHC: 32.8 g/dL (ref 30.0–36.0)
RDW: 15.4 % (ref 11.5–15.5)

## 2011-08-23 LAB — HEPATIC FUNCTION PANEL
AST: 28 U/L (ref 0–37)
Albumin: 2.9 g/dL — ABNORMAL LOW (ref 3.5–5.2)
Total Protein: 7.4 g/dL (ref 6.0–8.3)

## 2011-08-23 LAB — BASIC METABOLIC PANEL
Chloride: 82 mEq/L — ABNORMAL LOW (ref 96–112)
Creatinine, Ser: 6.97 mg/dL — ABNORMAL HIGH (ref 0.50–1.35)
GFR calc Af Amer: 10 mL/min — ABNORMAL LOW (ref >60)
Potassium: 4.4 mEq/L (ref 3.5–5.1)

## 2011-08-23 LAB — LIPASE, BLOOD: Lipase: 138 U/L — ABNORMAL HIGH (ref 11–59)

## 2011-08-23 MED ORDER — OXYCODONE-ACETAMINOPHEN 5-325 MG PO TABS: 2.0000 | ORAL_TABLET | ORAL | Status: AC | PRN

## 2011-08-23 MED ORDER — ONDANSETRON HCL 4 MG/2ML IJ SOLN
4.0000 mg | Freq: Once | INTRAMUSCULAR | Status: AC
  Administered 2011-08-23: 4 mg via INTRAVENOUS
  Filled 2011-08-23: qty 2

## 2011-08-23 MED ORDER — SODIUM CHLORIDE 0.9 % IV SOLN
Freq: Once | INTRAVENOUS | Status: AC
  Administered 2011-08-23: 21:00:00 via INTRAVENOUS

## 2011-08-23 MED ORDER — HYDROMORPHONE HCL 1 MG/ML IJ SOLN
1.0000 mg | Freq: Once | INTRAMUSCULAR | Status: AC
  Administered 2011-08-23: 1 mg via INTRAVENOUS

## 2011-08-23 MED ORDER — HYDROMORPHONE HCL 1 MG/ML IJ SOLN
1.0000 mg | Freq: Once | INTRAMUSCULAR | Status: AC
  Administered 2011-08-23: 1 mg via INTRAVENOUS
  Filled 2011-08-23: qty 1

## 2011-08-23 MED ORDER — HYDROMORPHONE HCL 1 MG/ML IJ SOLN
INTRAMUSCULAR | Status: AC
  Filled 2011-08-23: qty 1

## 2011-08-23 NOTE — ED Notes (Signed)
Lower abd pain for 2 weeks , rectal bleeding 2 days ago

## 2011-08-23 NOTE — ED Provider Notes (Signed)
History   Chart scribed for Geoffery Lyons, MD by Enos Fling; the patient was seen in room [facility]18/[facility]18; this patient's care was started at 8:35 PM.    CSN: [id number] Arrival date & time: 08/23/2011  8:24 PM  Chief Complaint  Patient presents with  . Abdominal Pain  . Rectal Bleeding   HPI [name] is a 50 y.o. male who presents to the Emergency Department complaining of abd pain. Pt reports diffuse abd pain, worse in RUQ, onset 2 week ago and worsening in past few days with abd distension today. Pain is associated nausea and small amount of blood in stools occasionally. No diarrhea or vomiting. No fever or chills. Pt reports h/o stomach ulcers but takes zantac and states abd pain is unusual for him. Last colonoscopy 3-4 months ago. Pt is on dialysis, cause of renal failure unknown.   Past Medical History  Diagnosis Date  . ESRD (end stage renal disease)   . Hypertension   . Pleural effusion   . Depression with anxiety   . Tobacco abuse disorder   . SOB (shortness of breath)   . Bruit   . Osteoarthritis   . ETOH abuse   . Homelessness   . H. pylori infection   . Erosive esophagitis     Past Surgical History  Procedure Date  . Total hip arthroplasty 05/2010  . Cholecystectomy   . Knee surgery     rt  . Av fistula placement 2010  . R vats drainage  of pl. eff.  talc pleurodesis, insertion of r  pleurx 06/12/11    Family History  Problem Relation Age of Onset  . Emphysema Father     was a smoker  . Heart disease Mother   . Cerebral aneurysm Brother     History  Substance Use Topics  . Smoking status: Current Everyday Smoker -- 0.5 packs/day for 40 years    Types: Cigarettes  . Smokeless tobacco: Never Used  . Alcohol Use: No   Previous Medications   AMLODIPINE (NORVASC) 10 MG TABLET    Take 10 mg by mouth daily.     ASPIRIN 81 MG TABLET    Take 81 mg by mouth daily.     CALCIUM ACETATE (PHOSLO) 667 MG CAPSULE    Take 2,001 mg by mouth 3 (three) times daily  with meals. And takes one tablet (667) with snacks   CLONIDINE (CATAPRES) 0.1 MG TABLET    Take 0.1 mg by mouth 2 (two) times daily.     FLUTICASONE-SALMETEROL (ADVAIR DISKUS) 250-50 MCG/DOSE AEPB    Inhale 1 puff into the lungs every 12 (twelve) hours.     FOLIC ACID-VITAMIN B COMPLEX-VITAMIN C-SELENIUM-ZINC (DIALYVITE) 3 MG TABS    Take 1 tablet by mouth daily. prior to dialysis on Tuesdays, Thursdays, and Saturdays   FUROSEMIDE (LASIX) 20 MG TABLET    Take 20 mg by mouth daily.     GLUCOSE BLOOD (ACCU-CHEK COMPACT TEST DRUM) TEST STRIP    1 each by Other route as needed. Use as instructed    HYDROCODONE-ACETAMINOPHEN (VICODIN) 5-500 MG PER TABLET    Use one every 4 hours as needed for pain   OMEPRAZOLE (PRILOSEC) 20 MG CAPSULE    Take 20 mg by mouth daily.     PROMETHAZINE (PHENERGAN) 25 MG TABLET    Take 12.5-25 mg by mouth 2 (two) times daily as needed. For nausea     TIOTROPIUM (SPIRIVA) 18 MCG INHALATION CAPSULE  Place 18 mcg into inhaler and inhale daily.     TRAZODONE (DESYREL) 50 MG TABLET    Take 50 mg by mouth at bedtime.       Allergies as of 08/23/2011 - Review Complete 08/23/2011  Allergen Reaction Noted  . Aspirin Other (See Comments) 05/18/2011  . Codeine Other (See Comments) 05/18/2011  . Morphine and related Hives 05/18/2011  . Nsaids Palpitations 05/18/2011  . Poison sumac extract Itching and Rash 07/27/2011      Review of Systems 10 Systems reviewed and are negative for acute change except as noted in the HPI.  Physical Exam  Pulse 89  Temp(Src) 97.8 F (36.6 C) (Oral)  Resp 17  Ht 5\' 5"  (1.651 m)  Wt 130 lb (58.968 kg)  BMI 21.63 kg/m2  SpO2 95%  Physical Exam  Nursing note and vitals reviewed. Constitutional: He is oriented to person, place, and time. He appears well-developed and well-nourished. No distress.  HENT:  Head: Normocephalic.  Mouth/Throat: Mucous membranes are normal.  Eyes: Conjunctivae are normal.  Neck: Normal range of motion. Neck  supple.  Cardiovascular: Normal rate, regular rhythm and intact distal pulses.  Exam reveals no gallop and no friction rub.   No murmur heard. Pulmonary/Chest: Effort normal and breath sounds normal. He has no wheezes. He has no rales.  Abdominal: He exhibits distension. There is tenderness (generalized). There is no rebound and no guarding.       abd is firm and mildly distended  Genitourinary: Rectum normal. Guaiac negative stool.  Musculoskeletal: Normal range of motion.  Neurological: He is alert and oriented to person, place, and time.  Skin: Skin is warm and dry. No rash noted.  Psychiatric: He has a normal mood and affect.   Procedures - none  OTHER DATA REVIEWED: Nursing notes and vital signs reviewed. Prior records reviewed.   DIAGNOSTIC STUDIES: Oxygen Saturation is 95% on room air, normal by my Interpretation.  Cardiac Monitor: 08/23/2011 8:32 PM Sinus, rate 92, no ectopy   LABS / RADIOLOGY: Results for orders placed during the hospital encounter of 08/23/11  CBC      Component Value Range   WBC 11.2 (*) 4.0 - 10.5 (K/uL)   RBC 3.79 (*) 4.22 - 5.81 (MIL/uL)   Hemoglobin 11.1 (*) 13.0 - 17.0 (g/dL)   HCT 16.1 (*) 09.6 - 52.0 (%)   MCV 89.2  78.0 - 100.0 (fL)   MCH 29.3  26.0 - 34.0 (pg)   MCHC 32.8  30.0 - 36.0 (g/dL)   RDW 04.5  40.9 - 81.1 (%)   Platelets 331  150 - 400 (K/uL)  DIFFERENTIAL      Component Value Range   Neutrophils Relative 69  43 - 77 (%)   Neutro Abs 7.8 (*) 1.7 - 7.7 (K/uL)   Lymphocytes Relative 16  12 - 46 (%)   Lymphs Abs 1.8  0.7 - 4.0 (K/uL)   Monocytes Relative 11  3 - 12 (%)   Monocytes Absolute 1.2 (*) 0.1 - 1.0 (K/uL)   Eosinophils Relative 4  0 - 5 (%)   Eosinophils Absolute 0.4  0.0 - 0.7 (K/uL)   Basophils Relative 1  0 - 1 (%)   Basophils Absolute 0.1  0.0 - 0.1 (K/uL)  BASIC METABOLIC PANEL      Component Value Range   Sodium 122 (*) 135 - 145 (mEq/L)   Potassium 4.4  3.5 - 5.1 (mEq/L)   Chloride 82 (*) 96 - 112  (mEq/L)  CO2 25  19 - 32 (mEq/L)   Glucose, Bld 90  70 - 99 (mg/dL)   BUN 33 (*) 6 - 23 (mg/dL)   Creatinine, Ser 9.60 (*) 0.50 - 1.35 (mg/dL)   Calcium 9.5  8.4 - 45.4 (mg/dL)   GFR calc non Af Amer 8 (*) >60 (mL/min)   GFR calc Af Amer 10 (*) >60 (mL/min)  HEPATIC FUNCTION PANEL      Component Value Range   Total Protein 7.4  6.0 - 8.3 (g/dL)   Albumin 2.9 (*) 3.5 - 5.2 (g/dL)   AST 28  0 - 37 (U/L)   ALT 16  0 - 53 (U/L)   Alkaline Phosphatase 221 (*) 39 - 117 (U/L)   Total Bilirubin 0.4  0.3 - 1.2 (mg/dL)   Bilirubin, Direct 0.2  0.0 - 0.3 (mg/dL)   Indirect Bilirubin 0.2 (*) 0.3 - 0.9 (mg/dL)  LIPASE, BLOOD      Component Value Range   Lipase 138 (*) 11 - 59 (U/L)   Ct Abdomen Pelvis Wo Contrast  08/23/2011  *RADIOLOGY REPORT*  Clinical Data: Abdominal pain, rectal bleeding.  CT ABDOMEN AND PELVIS WITHOUT CONTRAST  Technique:  Multidetector CT imaging of the abdomen and pelvis was performed following the standard protocol without intravenous contrast.  Comparison: 07/24/2010  Findings: Complex right hydropneumothorax with peripheral calcification and internal septations.  There is associated right lower lobe consolidation with air bronchograms which may represent atelectasis or pneumonia.  Coronary artery calcification. Cardiomegaly.  Mild left lung base opacity.  Organ evaluation is limited without intravenous contrast.  The liver is mildly enlarged.  Punctate calcifications suggest prior granulomas infection.  Status post cholecystectomy.  No biliary ductal dilatation.  Spleen is upper normal limits in size. Unremarkable pancreas and adrenal glands.  Atrophic native kidneys.  No hydronephrosis or hydroureter.  No bowel obstruction.  Large bowel diverticulosis without definite evidence for diverticulitis.  The appendix is within normal limits. Bilateral inguinal hernias.  Bladder is partially decompressed. Pelvis evaluation is significantly limited by streak artifact from the patient's  right hip arthroplasty.  There is a small amount of free fluid within the abdomen pelvis.  There is scattered atherosclerotic calcification of the aorta and its branches. No aneurysmal dilatation.  Multilevel degenerative changes of the imaged spine. No acute or aggressive appearing osseous lesion.  Right hip arthroplasty.  IMPRESSION: Complex right hydropneumothorax with peripheral calcification, suggesting a chronic process.  There is associated right lower lobe consolidation; atelectasis versus pneumonia.  An underlying mass cannot be excluded on this study.  Correlation with prior outside imaging recommended.  Limited bowel evaluation without oral or intravenous contrast. Colonic diverticulosis without definite evidence for diverticulitis. No bowel obstruction.  Small amount ascites.  Original Report Authenticated By: Waneta Martins, M.D.      ED COURSE:  All results reviewed and discussed with pt, questions answered, pt agreeable with plan.   MDM: Labs, CT scan essentially unremarkable.  IMPRESSION: Severe abd pain of undetermined etiology.  Patient states percocet helps his pain.     MEDS GIVEN IN ED: 0.9 %  sodium chloride infusion (  Intravenous New Bag 08/23/11 2056)  HYDROmorphone (DILAUDID) injection 1 mg (1 mg Intravenous Given 08/23/11 2052)  ondansetron (ZOFRAN) injection 4 mg (4 mg Intravenous Given 08/23/11 2053)  HYDROmorphone (DILAUDID) injection 1 mg (1 mg Intravenous Given 08/23/11 2143)     SCRIBE ATTESTATION: I personally performed the services described in this documentation, which was scribed in my presence. The  recorded information has been reviewed and considered. Geoffery Lyons, MD      Geoffery Lyons, MD 08/23/11 [phone]

## 2011-08-23 NOTE — ED Notes (Signed)
To ct scan

## 2011-08-23 NOTE — ED Notes (Signed)
Pt c/o severe ab. Pain for several days, denies any n/v or diarrhea, pain is all over, last bm today and was normal

## 2011-08-23 NOTE — ED Notes (Signed)
Pt returned from ct scan, witnessed family member giving pt water/soda to drink.  Advised pt and family that pt is not allowed anything by mouth until ct scan results are back. Verbalized understanding

## 2011-08-28 ENCOUNTER — Other Ambulatory Visit: Payer: Self-pay | Admitting: Thoracic Surgery

## 2011-08-30 ENCOUNTER — Other Ambulatory Visit: Payer: Self-pay | Admitting: Thoracic Surgery

## 2011-08-30 ENCOUNTER — Ambulatory Visit (INDEPENDENT_AMBULATORY_CARE_PROVIDER_SITE_OTHER): Payer: Self-pay | Admitting: Thoracic Surgery

## 2011-08-30 ENCOUNTER — Ambulatory Visit
Admission: RE | Admit: 2011-08-30 | Discharge: 2011-08-30 | Disposition: A | Discharge status: Home / Self Care | Payer: Medicare Other | Source: Ambulatory Visit | Attending: Thoracic Surgery | Admitting: Thoracic Surgery

## 2011-08-30 VITALS — BP 139/74 | HR 84 | Resp 20 | Ht 66.0 in | Wt 148.0 lb

## 2011-08-30 DIAGNOSIS — R0602 Shortness of breath: Secondary | ICD-10-CM

## 2011-08-30 DIAGNOSIS — J90 Pleural effusion, not elsewhere classified: Secondary | ICD-10-CM

## 2011-08-30 DIAGNOSIS — J942 Hemothorax: Secondary | ICD-10-CM | POA: Insufficient documentation

## 2011-08-30 NOTE — Progress Notes (Signed)
HPI [name] returns today he is complaining of right chest. Chest x-ray still showed questionable right pleural effusion. I plan to get a CT scan to look at this much close closer. I gave him a refill for Percocet #60. I will see him back again in one week for further evaluation.   Current Outpatient Prescriptions  Medication Sig Dispense Refill  . amLODipine (NORVASC) 10 MG tablet Take 10 mg by mouth daily.        Marland Kitchen aspirin 81 MG tablet Take 81 mg by mouth daily.        . calcium acetate (PHOSLO) 667 MG capsule Take 2,001 mg by mouth 3 (three) times daily with meals. And takes one tablet (667) with snacks      . cloNIDine (CATAPRES) 0.1 MG tablet Take 0.1 mg by mouth 2 (two) times daily.        . Fluticasone-Salmeterol (ADVAIR DISKUS) 250-50 MCG/DOSE AEPB Inhale 1 puff into the lungs every 12 (twelve) hours.        . folic acid-vitamin b complex-vitamin c-selenium-zinc (DIALYVITE) 3 MG TABS Take 1 tablet by mouth daily. prior to dialysis on Tuesdays, Thursdays, and Saturdays      . furosemide (LASIX) 20 MG tablet Take 20 mg by mouth daily.        Marland Kitchen glucose blood (ACCU-CHEK COMPACT TEST DRUM) test strip 1 each by Other route as needed. Use as instructed       . omeprazole (PRILOSEC) 20 MG capsule Take 20 mg by mouth daily.        Marland Kitchen tiotropium (SPIRIVA) 18 MCG inhalation capsule Place 18 mcg into inhaler and inhale daily.        Marland Kitchen HYDROcodone-acetaminophen (VICODIN) 5-500 MG per tablet Use one every 4 hours as needed for pain  30 tablet  0  . oxyCODONE-acetaminophen (PERCOCET) 5-325 MG per tablet Take 2 tablets by mouth every 4 (four) hours as needed for pain.  15 tablet  0  . promethazine (PHENERGAN) 25 MG tablet Take 12.5-25 mg by mouth 2 (two) times daily as needed. For nausea        . traZODone (DESYREL) 50 MG tablet Take 50 mg by mouth at bedtime.           Review of Systems: Right chest pain   Physical Exam  Cardiovascular: Normal rate, regular rhythm, normal heart sounds and intact  distal pulses.   Pulmonary/Chest: Effort normal. No respiratory distress. He has no wheezes.   decreased breath sounds on the right   Diagnostic Tests: Chest x-ray shows a questionable right pleural effusion probably loculated   Impression: End-stage renal disease right pleural effusion status post talc pleurocentesis   Plan: CT scan of the chest

## 2011-09-04 ENCOUNTER — Ambulatory Visit (HOSPITAL_COMMUNITY): Payer: Medicare Other

## 2011-09-06 ENCOUNTER — Ambulatory Visit (HOSPITAL_COMMUNITY)
Admission: RE | Admit: 2011-09-06 | Discharge: 2011-09-06 | Disposition: A | Discharge status: Home / Self Care | Payer: Medicare Other | Source: Ambulatory Visit | Attending: Thoracic Surgery | Admitting: Thoracic Surgery

## 2011-09-06 ENCOUNTER — Encounter: Payer: Self-pay | Admitting: Thoracic Surgery

## 2011-09-06 ENCOUNTER — Ambulatory Visit (INDEPENDENT_AMBULATORY_CARE_PROVIDER_SITE_OTHER): Payer: Self-pay | Admitting: Thoracic Surgery

## 2011-09-06 VITALS — BP 167/96 | HR 92 | Temp 96.8°F | Resp 20 | Ht 65.0 in | Wt 142.0 lb

## 2011-09-06 DIAGNOSIS — R599 Enlarged lymph nodes, unspecified: Secondary | ICD-10-CM | POA: Insufficient documentation

## 2011-09-06 DIAGNOSIS — R0602 Shortness of breath: Secondary | ICD-10-CM

## 2011-09-06 DIAGNOSIS — N269 Renal sclerosis, unspecified: Secondary | ICD-10-CM | POA: Insufficient documentation

## 2011-09-06 DIAGNOSIS — R0789 Other chest pain: Secondary | ICD-10-CM | POA: Insufficient documentation

## 2011-09-06 DIAGNOSIS — J90 Pleural effusion, not elsewhere classified: Secondary | ICD-10-CM

## 2011-09-06 NOTE — Progress Notes (Signed)
HPI the patient returns after his CT scan. CT scan shows a loculated right pleural effusion. There is entrapment of the right middle lobe and right lower lobe. I feel that he'll await to take care of this effusion is with a right thoracotomy with decortication. This will be more difficult because of the previous talc insertion. Since he is on dialysis on Tuesday Thursday and Saturday, we will schedule his surgery on Friday, October 12. The risk of the procedure were explained to the patient the patient agreed agrees with the procedure. He was given a prescription for Percocet 5/325 #60.   Current Outpatient Prescriptions  Medication Sig Dispense Refill  . amLODipine (NORVASC) 10 MG tablet Take 10 mg by mouth daily.        Marland Kitchen aspirin 81 MG tablet Take 81 mg by mouth daily.        . calcium acetate (PHOSLO) 667 MG capsule Take 2,001 mg by mouth 3 (three) times daily with meals. And takes one tablet (667) with snacks      . cloNIDine (CATAPRES) 0.1 MG tablet Take 0.1 mg by mouth 2 (two) times daily.        . Fluticasone-Salmeterol (ADVAIR DISKUS) 250-50 MCG/DOSE AEPB Inhale 1 puff into the lungs every 12 (twelve) hours.        . folic acid-vitamin b complex-vitamin c-selenium-zinc (DIALYVITE) 3 MG TABS Take 1 tablet by mouth daily. prior to dialysis on Tuesdays, Thursdays, and Saturdays      . furosemide (LASIX) 20 MG tablet Take 20 mg by mouth daily.        Marland Kitchen glucose blood (ACCU-CHEK COMPACT TEST DRUM) test strip 1 each by Other route as needed. Use as instructed       . HYDROcodone-acetaminophen (VICODIN) 5-500 MG per tablet Use one every 4 hours as needed for pain  30 tablet  0  . oxyCODONE-acetaminophen (PERCOCET) 5-325 MG per tablet Take 1 tablet by mouth every 4 (four) hours as needed.        . promethazine (PHENERGAN) 25 MG tablet Take 12.5-25 mg by mouth 2 (two) times daily as needed. For nausea        . tiotropium (SPIRIVA) 18 MCG inhalation capsule Place 18 mcg into inhaler and inhale daily.         . traZODone (DESYREL) 50 MG tablet Take 50 mg by mouth at bedtime.        Marland Kitchen omeprazole (PRILOSEC) 20 MG capsule Take 20 mg by mouth daily.           Review of Systems: Unchanged   Physical Exam   Diagnostic Tests CT scan showed a loculated right pleural effusion with pleural thickening and entrapped right middle lobe and right lower lobe:   Impression end-stage renal disease right lower lobe loculated pleural effusion with atelectasis right middle lobe and right lower lobe:   Plan: Decortication

## 2011-09-12 LAB — COMPREHENSIVE METABOLIC PANEL
ALT: 28
Albumin: 2.7 — ABNORMAL LOW
Calcium: 8.2 — ABNORMAL LOW
Glucose, Bld: 94
Sodium: 137
Total Protein: 5.5 — ABNORMAL LOW

## 2011-09-12 LAB — URINE MICROSCOPIC-ADD ON

## 2011-09-12 LAB — DIFFERENTIAL
Eosinophils Absolute: 0.1
Lymphs Abs: 1.7
Monocytes Absolute: 0.5
Monocytes Relative: 4
Neutro Abs: 9.3 — ABNORMAL HIGH
Neutrophils Relative %: 80 — ABNORMAL HIGH

## 2011-09-12 LAB — URINALYSIS, ROUTINE W REFLEX MICROSCOPIC
Glucose, UA: NEGATIVE
Ketones, ur: NEGATIVE
Leukocytes, UA: NEGATIVE
Nitrite: NEGATIVE
Specific Gravity, Urine: 1.015
pH: 6.5

## 2011-09-12 LAB — CBC
Hemoglobin: 14.7
MCHC: 34.4
Platelets: 356
RDW: 13.8

## 2011-09-20 ENCOUNTER — Ambulatory Visit (HOSPITAL_COMMUNITY)
Admission: RE | Admit: 2011-09-20 | Discharge: 2011-09-20 | Disposition: A | Discharge status: Home / Self Care | Payer: Medicare Other | Source: Ambulatory Visit | Attending: Thoracic Surgery | Admitting: Thoracic Surgery

## 2011-09-20 ENCOUNTER — Other Ambulatory Visit: Payer: Self-pay | Admitting: Thoracic Surgery

## 2011-09-20 ENCOUNTER — Encounter (HOSPITAL_COMMUNITY)
Admission: RE | Admit: 2011-09-20 | Discharge: 2011-09-20 | Disposition: A | Discharge status: Home / Self Care | Payer: Medicare Other | Source: Ambulatory Visit | Attending: Thoracic Surgery | Admitting: Thoracic Surgery

## 2011-09-20 DIAGNOSIS — I4510 Unspecified right bundle-branch block: Secondary | ICD-10-CM | POA: Insufficient documentation

## 2011-09-20 DIAGNOSIS — Z01810 Encounter for preprocedural cardiovascular examination: Secondary | ICD-10-CM | POA: Insufficient documentation

## 2011-09-20 DIAGNOSIS — Z01811 Encounter for preprocedural respiratory examination: Secondary | ICD-10-CM

## 2011-09-20 DIAGNOSIS — J90 Pleural effusion, not elsewhere classified: Secondary | ICD-10-CM | POA: Insufficient documentation

## 2011-09-20 DIAGNOSIS — Z01818 Encounter for other preprocedural examination: Secondary | ICD-10-CM | POA: Insufficient documentation

## 2011-09-20 DIAGNOSIS — N186 End stage renal disease: Secondary | ICD-10-CM | POA: Insufficient documentation

## 2011-09-20 DIAGNOSIS — I517 Cardiomegaly: Secondary | ICD-10-CM | POA: Insufficient documentation

## 2011-09-20 DIAGNOSIS — Z01812 Encounter for preprocedural laboratory examination: Secondary | ICD-10-CM | POA: Insufficient documentation

## 2011-09-20 LAB — BLOOD GAS, ARTERIAL
Acid-Base Excess: 0.7 mmol/L (ref 0.0–2.0)
Bicarbonate: 24.3 mEq/L — ABNORMAL HIGH (ref 20.0–24.0)
TCO2: 25.4 mmol/L (ref 0–100)
pCO2 arterial: 35.3 mmHg (ref 35.0–45.0)
pO2, Arterial: 68.1 mmHg — ABNORMAL LOW (ref 80.0–100.0)

## 2011-09-20 LAB — APTT: aPTT: 33 seconds (ref 24–37)

## 2011-09-20 LAB — COMPREHENSIVE METABOLIC PANEL
ALT: 12 U/L (ref 0–53)
Albumin: 3.1 g/dL — ABNORMAL LOW (ref 3.5–5.2)
Alkaline Phosphatase: 178 U/L — ABNORMAL HIGH (ref 39–117)
BUN: 31 mg/dL — ABNORMAL HIGH (ref 6–23)
Potassium: 4.4 mEq/L (ref 3.5–5.1)
Sodium: 133 mEq/L — ABNORMAL LOW (ref 135–145)
Total Protein: 7.1 g/dL (ref 6.0–8.3)

## 2011-09-20 LAB — PROTIME-INR
INR: 1.12 (ref 0.00–1.49)
Prothrombin Time: 14.6 seconds (ref 11.6–15.2)

## 2011-09-20 LAB — CBC
HCT: 33.5 % — ABNORMAL LOW (ref 39.0–52.0)
MCV: 86.6 fL (ref 78.0–100.0)
RBC: 3.87 MIL/uL — ABNORMAL LOW (ref 4.22–5.81)
WBC: 9.4 10*3/uL (ref 4.0–10.5)

## 2011-09-20 LAB — SURGICAL PCR SCREEN: MRSA, PCR: POSITIVE — AB

## 2011-09-21 NOTE — H&P (Signed)
NAMEZEBASTIAN, CARICO NO.:  [id number]  MEDICAL RECORD NO.:  [id number]  LOCATION:                                 FACILITY:  PHYSICIAN:  Ines Bloomer, M.D. DATE OF BIRTH:  Jul 08, 1961  DATE OF ADMISSION:  09/22/2011 DATE OF DISCHARGE:                             HISTORY & PHYSICAL   CHIEF COMPLAINT:  Right pleural effusion.  HISTORY OF PRESENT ILLNESS:  This 50 year old dialysis-dependent patient developed a right pleural effusion.  On June 12, 2011, he underwent a right VATS drainage of pleural effusion with talc pleurodesis and insertion of PleurX catheter.  Postoperatively, he had developed a hematoma in his chest and his PleurX catheter would not drain the effusion and was eventually removed.  We watched him over 2-3 months and he has developed a loculated right pleural effusion that is compressed in his right middle lobe and his right lower lobe.  He is being readmitted for decortication of this right middle lobe and right lower lobe.  He has some pain from previous surgery.  I have planned to do a full thoracotomy at this time in order to do the decortication. Hopefully, it will improve his shortness of breath as well as his chronic pain.  I have explained the situation to him in detail including the risk of further hemorrhage, infection, pulmonary embolus, myocardial infarction, wound problems, and other complications.  He agrees to the surgery.  We think we have a good chance in this surgery with a moderate risk to accomplish the decortication and obtain a good pleurodesis.  He has had no fever, chills, or excessive sputum.  Dialysis made no difference __________ findings.  MEDICATIONS:  Include aspirin, amantadine, folic acid, selenium, Lasix, Vicodin, Prilosec, Spiriva, Prevacid.  He dialyzes on Tuesday, Thursday, and Saturday.  ALLERGIES:  He is allergic to ASPIRIN and NSAIDs cause stomach ulcers, CODEINE causes nausea and MORPHINE.  He  has chronic obstructive pulmonary disease, coronary artery disease, hypertension, end-stage renal disease, polysubstance abuse, hepatitis C, history of GERD, and diabetes mellitus.  FAMILY HISTORY:  Noncontributory.  SOCIAL HISTORY:  Has 1 child.  He continues to smoke a pack of cigarettes a day.  He does not drink alcohol on a regular basis.  REVIEW OF SYSTEMS:  CONSTITUTIONAL:  His weight is stable.  CARDIAC:  No recent angina or atrial fibrillation.  PULMONARY:  No hemoptysis.  See history of present illness.  GI:  Peptic ulcer disease and reflux.  GU: See history of present illness.  VASCULAR:  No claudication, DVT, or TIAs.  NEUROLOGICAL:  No dizziness, headaches, blackouts, or seizures. MUSCULOSKELETAL:  Right chest pain.  No arthritis.  PSYCHIATRIC:  No depression or nervousness.  EYE/ENT:  No changes in eyesight or hearing. HEMATOLOGICAL:  No problems with bleeding, clotting disorders, but does have anemia.  PHYSICAL EXAMINATION:  GENERAL:  He is a well-developed Caucasian male, in no acute distress. VITAL SIGNS:  His blood pressure is 155/90, pulse 90, respirations 16, sats were 98%. HEAD:  Atraumatic. EYES:  Pupils equal and reactive to light and accommodation. Extraocular movements are normal. EARS:  Tympanic membranes are intact. NOSE:  There is no septal deviation. THROAT:  Without lesion. NECK:  Supple without thyromegaly.  There is no supraclavicular or axillary adenopathy. CHEST:  Decreased breath sounds on right lower lobe and some thoracoscopy incision scars. HEART:  Regular sinus rhythm.  No murmur. ABDOMEN:  Soft.  There is no hepatosplenomegaly. EXTREMITIES:  There is a left AV fistula.  There is no edema.  No clubbing. NEUROLOGIC:  He is oriented x3.  Sensory and motor intact.  Cranial nerves intact.  IMPRESSION: 1. Recurrent loculated right pleural effusion. 2. End-stage renal disease. 3. Hypertension. 4. Chronic obstructive pulmonary disease. 5.  Dyslipidemia. 6. History of tobacco abuse. 7. Diabetes mellitus type 2. 8. Gastroesophageal reflux. 9. History of hepatitis C. 10.Coronary artery disease.  PLAN:  Right VATS for probable right thoracotomy with decortication.     Ines Bloomer, M.D.     DPB/MEDQ  D:  09/20/2011  T:  09/21/2011  Job:  [id number]  Electronically Signed by Jovita Gamma M.D. on 09/21/2011 04:53:49 PM

## 2011-09-22 ENCOUNTER — Inpatient Hospital Stay (HOSPITAL_COMMUNITY): Payer: Medicare Other

## 2011-09-22 ENCOUNTER — Other Ambulatory Visit: Payer: Self-pay | Admitting: Thoracic Surgery

## 2011-09-22 ENCOUNTER — Inpatient Hospital Stay (HOSPITAL_COMMUNITY)
Admission: RE | Admit: 2011-09-22 | Discharge: 2011-09-27 | DRG: 163 | Disposition: A | Discharge status: Home / Self Care | Payer: Medicare Other | Source: Ambulatory Visit | Attending: Thoracic Surgery | Admitting: Thoracic Surgery

## 2011-09-22 DIAGNOSIS — Z992 Dependence on renal dialysis: Secondary | ICD-10-CM

## 2011-09-22 DIAGNOSIS — F3289 Other specified depressive episodes: Secondary | ICD-10-CM | POA: Diagnosis present

## 2011-09-22 DIAGNOSIS — N2581 Secondary hyperparathyroidism of renal origin: Secondary | ICD-10-CM | POA: Diagnosis present

## 2011-09-22 DIAGNOSIS — D62 Acute posthemorrhagic anemia: Secondary | ICD-10-CM | POA: Diagnosis not present

## 2011-09-22 DIAGNOSIS — N186 End stage renal disease: Secondary | ICD-10-CM | POA: Diagnosis present

## 2011-09-22 DIAGNOSIS — J4489 Other specified chronic obstructive pulmonary disease: Secondary | ICD-10-CM | POA: Diagnosis present

## 2011-09-22 DIAGNOSIS — D638 Anemia in other chronic diseases classified elsewhere: Secondary | ICD-10-CM | POA: Diagnosis not present

## 2011-09-22 DIAGNOSIS — F17200 Nicotine dependence, unspecified, uncomplicated: Secondary | ICD-10-CM | POA: Diagnosis present

## 2011-09-22 DIAGNOSIS — I2510 Atherosclerotic heart disease of native coronary artery without angina pectoris: Secondary | ICD-10-CM | POA: Diagnosis present

## 2011-09-22 DIAGNOSIS — B1920 Unspecified viral hepatitis C without hepatic coma: Secondary | ICD-10-CM | POA: Diagnosis present

## 2011-09-22 DIAGNOSIS — K219 Gastro-esophageal reflux disease without esophagitis: Secondary | ICD-10-CM | POA: Diagnosis present

## 2011-09-22 DIAGNOSIS — Z79899 Other long term (current) drug therapy: Secondary | ICD-10-CM

## 2011-09-22 DIAGNOSIS — E119 Type 2 diabetes mellitus without complications: Secondary | ICD-10-CM | POA: Diagnosis present

## 2011-09-22 DIAGNOSIS — J449 Chronic obstructive pulmonary disease, unspecified: Secondary | ICD-10-CM | POA: Diagnosis present

## 2011-09-22 DIAGNOSIS — Z7982 Long term (current) use of aspirin: Secondary | ICD-10-CM

## 2011-09-22 DIAGNOSIS — J90 Pleural effusion, not elsewhere classified: Principal | ICD-10-CM | POA: Diagnosis present

## 2011-09-22 DIAGNOSIS — I120 Hypertensive chronic kidney disease with stage 5 chronic kidney disease or end stage renal disease: Secondary | ICD-10-CM | POA: Diagnosis present

## 2011-09-22 DIAGNOSIS — F329 Major depressive disorder, single episode, unspecified: Secondary | ICD-10-CM | POA: Diagnosis present

## 2011-09-22 LAB — BASIC METABOLIC PANEL
CO2: 22 mEq/L (ref 19–32)
Chloride: 92 mEq/L — ABNORMAL LOW (ref 96–112)
Creatinine, Ser: 6.93 mg/dL — ABNORMAL HIGH (ref 0.50–1.35)
Glucose, Bld: 100 mg/dL — ABNORMAL HIGH (ref 70–99)
Sodium: 130 mEq/L — ABNORMAL LOW (ref 135–145)

## 2011-09-22 LAB — CBC
HCT: 32.4 % — ABNORMAL LOW (ref 39.0–52.0)
Hemoglobin: 10.7 g/dL — ABNORMAL LOW (ref 13.0–17.0)
MCH: 28.6 pg (ref 26.0–34.0)
MCHC: 33 g/dL (ref 30.0–36.0)
RBC: 3.74 MIL/uL — ABNORMAL LOW (ref 4.22–5.81)

## 2011-09-22 LAB — GLUCOSE, CAPILLARY: Glucose-Capillary: 106 mg/dL — ABNORMAL HIGH (ref 70–99)

## 2011-09-22 LAB — PREPARE RBC (CROSSMATCH)

## 2011-09-23 ENCOUNTER — Inpatient Hospital Stay (HOSPITAL_COMMUNITY): Payer: Medicare Other

## 2011-09-23 LAB — BASIC METABOLIC PANEL
BUN: 41 mg/dL — ABNORMAL HIGH (ref 6–23)
CO2: 21 mEq/L (ref 19–32)
Chloride: 90 mEq/L — ABNORMAL LOW (ref 96–112)
Creatinine, Ser: 7.48 mg/dL — ABNORMAL HIGH (ref 0.50–1.35)
Glucose, Bld: 124 mg/dL — ABNORMAL HIGH (ref 70–99)

## 2011-09-23 LAB — POCT I-STAT 3, ART BLOOD GAS (G3+)
Bicarbonate: 22.2 mEq/L (ref 20.0–24.0)
O2 Saturation: 98 %
TCO2: 23 mmol/L (ref 0–100)
pCO2 arterial: 36.2 mmHg (ref 35.0–45.0)
pH, Arterial: 7.398 (ref 7.350–7.450)

## 2011-09-23 LAB — CBC
HCT: 26.2 % — ABNORMAL LOW (ref 39.0–52.0)
Hemoglobin: 8.5 g/dL — ABNORMAL LOW (ref 13.0–17.0)
MCH: 28.6 pg (ref 26.0–34.0)
MCV: 86.2 fL (ref 78.0–100.0)
RBC: 3.04 MIL/uL — ABNORMAL LOW (ref 4.22–5.81)
WBC: 19.3 10*3/uL — ABNORMAL HIGH (ref 4.0–10.5)

## 2011-09-24 ENCOUNTER — Inpatient Hospital Stay (HOSPITAL_COMMUNITY): Payer: Medicare Other

## 2011-09-24 LAB — CBC
HCT: 26.7 % — ABNORMAL LOW (ref 39.0–52.0)
MCH: 28.5 pg (ref 26.0–34.0)
MCV: 86.4 fL (ref 78.0–100.0)
RDW: 15.6 % — ABNORMAL HIGH (ref 11.5–15.5)
WBC: 14 10*3/uL — ABNORMAL HIGH (ref 4.0–10.5)

## 2011-09-24 LAB — COMPREHENSIVE METABOLIC PANEL
Albumin: 2.4 g/dL — ABNORMAL LOW (ref 3.5–5.2)
BUN: 24 mg/dL — ABNORMAL HIGH (ref 6–23)
Chloride: 89 mEq/L — ABNORMAL LOW (ref 96–112)
Creatinine, Ser: 5.16 mg/dL — ABNORMAL HIGH (ref 0.50–1.35)
GFR calc non Af Amer: 12 mL/min — ABNORMAL LOW (ref >90)
Total Bilirubin: 0.5 mg/dL (ref 0.3–1.2)

## 2011-09-25 ENCOUNTER — Inpatient Hospital Stay (HOSPITAL_COMMUNITY): Payer: Medicare Other

## 2011-09-25 LAB — TISSUE CULTURE
Culture: NO GROWTH
Gram Stain: NONE SEEN

## 2011-09-25 LAB — BODY FLUID CULTURE

## 2011-09-25 LAB — CBC
MCV: 87.1 fL (ref 78.0–100.0)
Platelets: 271 10*3/uL (ref 150–400)
RBC: 3.1 MIL/uL — ABNORMAL LOW (ref 4.22–5.81)
WBC: 14.2 10*3/uL — ABNORMAL HIGH (ref 4.0–10.5)

## 2011-09-25 LAB — RENAL FUNCTION PANEL
CO2: 26 mEq/L (ref 19–32)
Chloride: 86 mEq/L — ABNORMAL LOW (ref 96–112)
GFR calc Af Amer: 10 mL/min — ABNORMAL LOW (ref >90)
GFR calc non Af Amer: 9 mL/min — ABNORMAL LOW (ref >90)
Sodium: 125 mEq/L — ABNORMAL LOW (ref 135–145)

## 2011-09-25 LAB — POCT I-STAT 4, (NA,K, GLUC, HGB,HCT)
Glucose, Bld: 103 mg/dL — ABNORMAL HIGH (ref 70–99)
Potassium: 3.9 mEq/L (ref 3.5–5.1)

## 2011-09-25 NOTE — Op Note (Signed)
NAMEELGAR, SCOGGINS NO.:  [id number]  MEDICAL RECORD NO.:  [id number]  LOCATION:  2550                         FACILITY:  [facility]  PHYSICIAN:  Ines Bloomer, M.D. DATE OF BIRTH:  January 26, 1961  DATE OF PROCEDURE: DATE OF DISCHARGE:                              OPERATIVE REPORT   PREOPERATIVE DIAGNOSIS:  Loculated right pleural effusion.  POSTOPERATIVE DIAGNOSIS:  Loculated right pleural effusion.  OPERATION PERFORMED:  Right thoracotomy with drainage of loculated effusion with decortication.  SURGEON:  Ines Bloomer, MD  FIRST ASSISTANT:  Coral Ceo, PA-C  This patient had previously talc pleurodesis and a right VATS for pleural effusion.  Afterwards, there was some evidence of some bleeding and it was felt the patient may have developed a hematoma.  The tubes have been removed because they had not drained and the patient continued to have an evidence of loculated pleural effusion on her CT scan and complaining of pain and shortness of breath.  They brought him back for decortication.  He has end-stage renal disease.  After general anesthesia, he was turned to the right lateral thoracotomy position.  Dual-lumen tube was inserted.  Percutaneous insertion of all monitoring lines.  The patient's right lung was deflated. Posterolateral thoracotomy was done.  Then the latissimus was divided, the serratus was reflected anteriorly.  The sixth intercostal space was entered.  On entering the space, an extra resection of the thickened 6 mm of pleura dividing it, dissecting it down medially, laterally, superiorly, and inferiorly.  We then opened the pleura and found a large loculated hemothorax with lot blood in place.  There was a marked amount of reaction, this area was at least 10-15 cm in diameter and 10 cm in height and 10 cm in depth.  We started dissecting it superiorly and found the lung and dissected it with sharp and blunt dissection off the upper  lobe and the middle lobe, the lower lobe was inferior to this and we dissected off the lower lobe also.  The CT scan showed that the lower lobe and the middle lobe were atelectatic.  We continued to dissect this thickened extrapleural resection off the chest wall laterally and dividing the pleura with scissors and electrocautery.  We then dissected down to the diaphragm medially and dissected off the lower lobe, middle lobe and the upper lobe to the pericardium medially.  This was a very tedious dissection, but we were able to get it off the lung.  There were several small tears in the lung, which were repaired with 3-0 Vicryl. The thickened pleura was then removed and we turned our attention to the diaphragm where it was stuck on the diaphragm and then we dissected off the diaphragm just leaving a small rim of the thickened pleura at the costovertebral angle, but this took at least 2 hours to dissect this off.  After all of the thickened pleura had been removed, we sent some of this for culture and the rest to the pathology examination.  We put 2 chest tubes in 3 separate incisions and sutured in place with 0-silk. Single On Q was inserted in the usual fashion.  Marcaine block was done  in the usual fashion.  Four pericostals were inserted drilling through the 7th rib and passed around the 6th rib, #1 Vicryl in the muscle layer, 2-0 Vicryl in the subcutaneous tissue, and Dermabond for the skin.  The patient was returned to the recovery room in serious condition.     Ines Bloomer, M.D.     DPB/MEDQ  D:  09/22/2011  T:  09/22/2011  Job:  [id number]  Electronically Signed by Jovita Gamma M.D. on 09/25/2011 02:19:59 PM

## 2011-09-26 ENCOUNTER — Inpatient Hospital Stay (HOSPITAL_COMMUNITY): Payer: Medicare Other

## 2011-09-26 LAB — CBC
MCH: 28.5 pg (ref 26.0–34.0)
MCV: 87.1 fL (ref 78.0–100.0)
Platelets: 321 10*3/uL (ref 150–400)
RDW: 14.8 % (ref 11.5–15.5)

## 2011-09-26 LAB — BASIC METABOLIC PANEL
CO2: 24 mEq/L (ref 19–32)
Calcium: 10.5 mg/dL (ref 8.4–10.5)
Creatinine, Ser: 8.41 mg/dL — ABNORMAL HIGH (ref 0.50–1.35)
GFR calc Af Amer: 8 mL/min — ABNORMAL LOW (ref >90)
GFR calc non Af Amer: 7 mL/min — ABNORMAL LOW (ref >90)
Sodium: 123 mEq/L — ABNORMAL LOW (ref 135–145)

## 2011-09-27 ENCOUNTER — Inpatient Hospital Stay (HOSPITAL_COMMUNITY): Payer: Medicare Other

## 2011-09-27 LAB — BASIC METABOLIC PANEL WITH GFR
BUN: 19 mg/dL (ref 6–23)
CO2: 27 meq/L (ref 19–32)
Calcium: 9.6 mg/dL (ref 8.4–10.5)
Chloride: 92 meq/L — ABNORMAL LOW (ref 96–112)
Creatinine, Ser: 4.97 mg/dL — ABNORMAL HIGH (ref 0.50–1.35)
GFR calc Af Amer: 15 mL/min — ABNORMAL LOW
GFR calc non Af Amer: 13 mL/min — ABNORMAL LOW
Glucose, Bld: 126 mg/dL — ABNORMAL HIGH (ref 70–99)
Potassium: 4.2 meq/L (ref 3.5–5.1)
Sodium: 130 meq/L — ABNORMAL LOW (ref 135–145)

## 2011-09-27 LAB — CBC
MCV: 88.7 fL (ref 78.0–100.0)
Platelets: 315 10*3/uL (ref 150–400)
RBC: 3.1 MIL/uL — ABNORMAL LOW (ref 4.22–5.81)
RDW: 15.1 % (ref 11.5–15.5)
WBC: 11.5 10*3/uL — ABNORMAL HIGH (ref 4.0–10.5)

## 2011-09-27 LAB — TYPE AND SCREEN
ABO/RH(D): A POS
Unit division: 0

## 2011-09-28 ENCOUNTER — Other Ambulatory Visit: Payer: Self-pay | Admitting: Thoracic Surgery

## 2011-09-28 DIAGNOSIS — J90 Pleural effusion, not elsewhere classified: Secondary | ICD-10-CM

## 2011-10-01 NOTE — Discharge Summary (Signed)
[name], [name] NO.:  [id number]  MEDICAL RECORD NO.:  [id number]  LOCATION:  3302                         FACILITY:  [facility]  PHYSICIAN:  [name], M.D. DATE OF BIRTH:  1961/04/28  DATE OF ADMISSION:  09/22/2011 DATE OF DISCHARGE:                              DISCHARGE SUMMARY   PRIMARY ADMITTING DIAGNOSIS:  Recurrent right pleural effusion.  ADDITIONAL/DISCHARGE DIAGNOSES: 1. Recurrent right pleural effusion. 2. History of right pleural effusion status post previous video-     assisted thoracoscopic surgery with drainage of effusion and talc     pleurodesis in July 2012. 3. End-stage renal disease, on hemodialysis, Tuesday, Thursday, and     Saturday. 4. Hepatitis C. 5. Gastroesophageal reflux disease. 6. Ongoing tobacco abuse. 7. Chronic obstructive pulmonary disease. 8. Coronary artery disease. 9. Dyslipidemia. 10.Type 2 diabetes mellitus. 11.History of polysubstance abuse.  PROCEDURES PERFORMED:  Right thoracotomy with drainage of loculated effusion and decortication.  HISTORY:  The patient is a 50 year old male who initially was seen in July 2012 with a loculated right pleural effusion.  He underwent a right VATS with drainage of effusion, talc pleurodesis, and insertion of a Pleurx catheter at that time.  Postoperatively, he developed a hematoma in the chest and his Pleurx catheter would not drain the effusion and eventually was discontinued.  He has been observed over the past 2-3 months and he has now developed loculations of his right effusion compressing the right middle and right lower lobes.  He is having some symptoms of shortness of breath as well as chest discomfort.  Dr. Edwyna [name] recently saw him back in the office and felt that he would benefit from a thoracotomy with drainage of the effusion and decortication at this time.  All risks, benefits, and alternatives of surgery were explained to the patient and he agreed to  proceed.  HOSPITAL COURSE:  [name] was admitted to [facility] on the date of admission and was taken to the operating room where he underwent a right thoracotomy with drainage of his effusion and decortication.  Please see previously dictated operative report for complete details of surgery. The patient tolerated the procedure well and was transferred to the SICU for postoperative convalescence.  He has done well postoperatively.  He has continued on the same hemodialysis schedule and has been followed closely by the Nephrology Service during his admission.  His chest tube drainage has slowly decreased and both chest tubes have been discontinued at this time.  He is ambulating in the halls without difficulty.  He is maintaining O2 sats of greater than 90% on room air. His incisions are all healing well.  He has been transferred to the Step- Down Unit and is doing well.  His most recent labs show a sodium of 123, potassium 5.6, BUN 47, and creatinine 8.41.  White count 14.6, hemoglobin 8.8, hematocrit 26.9, and platelets 321.  Intraoperative cultures of fluid and tissue were all negative.  Pathology showed fibrotic tissue with inflammation, no malignancy.  His most recent chest x-ray was stable with no pneumothorax.  He will undergo a repeat PA and lateral chest x-ray on the morning of September 27, 2011.  It is felt that if he continues to remain stable, he will be ready for discharge home at that time.  DISCHARGE MEDICATIONS: 1. Nicotine patch 21 mg daily. 2. Oxycodone IR 5-10 mg q.6 hours p.r.n. pain. 3. PhosLo 667 mg 4 capsules t.i.d. 4. Enteric-coated aspirin 81 mg daily. 5. Clonidine 0.1 mg b.i.d. 6. Omeprazole 20 mg daily. 7. ProAir 1-2 puffs daily p.r.n. shortness of breath. 8. Promethazine 25 mg b.i.d. p.r.n. nausea. 9. QVAR 40 mcg daily. 10.Renal vitamin daily after dialysis. 11.Spiriva 18 mcg daily. 12.Trazodone 50 mg at bedtime p.r.n. sleep.  BRIEF DISCHARGE  INSTRUCTIONS:  He is asked to refrain from driving, heavy lifting, or strenuous activity.  He may continue ambulation daily and use his incentive spirometer.  He may shower daily and clean his incisions with soap and water.  He will continue his same preoperative diet.  DISCHARGE FOLLOWUP:  He will return to see Dr. Edwyna [name] in the office in 1 week with a chest x-ray.  He will continue his same hemodialysis schedule.  In the interim, if he experiences any problems or has questions he is asked to contact our office immediately.     [name], P.A.   ______________________________ [name], M.D.    GC/MEDQ  D:  09/26/2011  T:  09/26/2011  Job:  [id number]  cc:   [name], M.D. [facility] Office  Electronically Signed by [name]. on 09/27/2011 10:43:12 AM Electronically Signed by [name] M.D. on 10/01/2011 09:10:17 PM

## 2011-10-04 ENCOUNTER — Ambulatory Visit (INDEPENDENT_AMBULATORY_CARE_PROVIDER_SITE_OTHER): Payer: Self-pay | Admitting: Thoracic Surgery

## 2011-10-04 ENCOUNTER — Ambulatory Visit
Admission: RE | Admit: 2011-10-04 | Discharge: 2011-10-04 | Disposition: A | Discharge status: Home / Self Care | Payer: Medicare Other | Source: Ambulatory Visit | Attending: Thoracic Surgery | Admitting: Thoracic Surgery

## 2011-10-04 ENCOUNTER — Encounter: Payer: Self-pay | Admitting: Thoracic Surgery

## 2011-10-04 VITALS — BP 140/82 | HR 85 | Resp 14 | Ht 64.0 in | Wt 139.0 lb

## 2011-10-04 DIAGNOSIS — J90 Pleural effusion, not elsewhere classified: Secondary | ICD-10-CM

## 2011-10-04 NOTE — Progress Notes (Signed)
HPI patient returns status post decortication. Chest x-ray still shows some reaction air-fluid levels but is improved. Lungs are clear to auscultation.His incisions are healing well. We removed his chest tube sutures. Again we filled for oxycodone and immediate release #60,5mg . Dosing back again in 2 weeks with a chest x-ray  Current Outpatient Prescriptions  Medication Sig Dispense Refill  . cloNIDine (CATAPRES) 0.1 MG tablet Take 0.1 mg by mouth 2 (two) times daily.        Marland Kitchen HYDROcodone-acetaminophen (VICODIN) 5-500 MG per tablet Use one every 4 hours as needed for pain  30 tablet  0  . amLODipine (NORVASC) 10 MG tablet Take 10 mg by mouth daily.        Marland Kitchen aspirin 81 MG tablet Take 81 mg by mouth daily.        . calcium acetate (PHOSLO) 667 MG capsule Take 2,001 mg by mouth 3 (three) times daily with meals. And takes one tablet (667) with snacks      . Fluticasone-Salmeterol (ADVAIR DISKUS) 250-50 MCG/DOSE AEPB Inhale 1 puff into the lungs every 12 (twelve) hours.        . folic acid-vitamin b complex-vitamin c-selenium-zinc (DIALYVITE) 3 MG TABS Take 1 tablet by mouth daily. prior to dialysis on Tuesdays, Thursdays, and Saturdays      . furosemide (LASIX) 20 MG tablet Take 20 mg by mouth daily.        Marland Kitchen glucose blood (ACCU-CHEK COMPACT TEST DRUM) test strip 1 each by Other route as needed. Use as instructed       . omeprazole (PRILOSEC) 20 MG capsule Take 20 mg by mouth daily.        Marland Kitchen oxyCODONE-acetaminophen (PERCOCET) 5-325 MG per tablet Take 1 tablet by mouth every 4 (four) hours as needed.        . promethazine (PHENERGAN) 25 MG tablet Take 12.5-25 mg by mouth 2 (two) times daily as needed. For nausea        . tiotropium (SPIRIVA) 18 MCG inhalation capsule Place 18 mcg into inhaler and inhale daily.        . traZODone (DESYREL) 50 MG tablet Take 50 mg by mouth at bedtime.           Review of Systems:unchanged   Physical Exam  Cardiovascular: Normal rate, regular rhythm and normal  heart sounds.   Pulmonary/Chest: Effort normal and breath sounds normal. No respiratory distress.     Diagnostic Tests: Chest x-ray shows some reaction air-fluid levels in the right   Impression: Status post decortication right lower lobe   Plan: Return in 2 week

## 2011-10-04 NOTE — Consult Note (Signed)
NAMEELWYN, [name] NO.:  [id number]  MEDICAL RECORD NO.:  [id number]  LOCATION:  2311                         FACILITY:  [facility]  PHYSICIAN:  [name], M.D.DATE OF BIRTH:  [birth year]  DATE OF CONSULTATION:  09/22/2011 DATE OF DISCHARGE:                                CONSULTATION   CONSULTING PHYSICIAN:  Bob Bloomer, MD  REASON FOR CONSULTATION:  Management of end-stage renal disease related medical issues in a patient who dialyzes in [place].  PRIMARY NEPHROLOGIST:  Bob Loa, MD  HISTORY OF PRESENT ILLNESS:  [name] is a 50 year old white male with past medical history significant for end-stage renal disease, hypertension, coronary artery disease, history of substance abuse, and hepatitis C who was admitted for right thoracotomy and decortication procedure related to a chronic and recurrent right pleural effusion.  He has undergone a VATS procedure in the past and we had seen him in consultation in July and maintain his dialysis care during his stay. Now he returns for another procedure.  ALLERGIES:  ASPIRIN and CODEINE which causes hives.  PAST MEDICAL HISTORY: 1. He has end-stage renal disease, on dialysis every Tuesday,     Thursday, Saturday.  Dry weight of 59 kg. 2. Hypertension. 3. Chronic right pleural effusion as above. 4. Chronic COPD. 5. Tobacco abuse ongoing. 6. Coronary artery disease. 7. History of alcohol abuse, none for 3 years. 8. Marijuana abuse. 9. Hepatitis C. 10.Gastroesophageal reflux disease. 11.Depression. 12.Secondary hyperparathyroidism. 13.Anemia of chronic disease.  OUTPATIENT MEDICATIONS: 1. PhosLo 4 with each meal. 2. Amlodipine 10 mg a day. 3. Clonidine 0.1 mg b.i.d. 4. Hectorol 1 mcg with dialysis each treatment. 5. Vicodin p.r.n. 6. Trazodone 50 mg at bedtime. 7. Spiriva 18 mcg inhaled daily. 8. Phenergan 25 mg p.r.n. 9. ProAir inhaler 1-2 puffs daily. 10.QVAR 40 mcg  daily. 11.Omeprazole 20 mg daily. 12.Lasix 20 mg daily. 13.Nephro-Vite 1 daily. 14.Aspirin 81 mg daily.  FAMILY HISTORY:  Mother died of complications of abdominal aortic aneurysm.  Father had a pleural effusion and died as well, he was elderly.  SOCIAL HISTORY:  He is married for 20 years.  He has 1 child.  He is currently separated from his wife.  Continues to smoke 1/2 pack cigarettes a day.  Has a 50-pack-year tobacco history, quit alcohol 3 years ago.  REVIEW OF SYSTEMS:  GENERAL:  He has had some shortness of breath, dyspnea on exertion, right-sided chest pain.  HEENT:  No tinnitus, dysphagia, odynophagia.  CARDIAC:  No palpitations.  Chest pain was right-sided.  PULMONARY:  As above.  He has had some shortness of breath and hemoptysis in the past.  GI:  Had an episode of nausea and vomiting yesterday and is resolved today.  GU:  No dysuria, pyuria, hematuria, urgency, frequency, retention.  RHEUMATOLOGIC:  No arthralgias or myalgias.  DERMATOLOGIC:  No rashes, lumps, or bumps.  All other systems negative.  PHYSICAL EXAMINATION:  GENERAL:  He is a well-developed, well-nourished man sitting up in bed, in no apparent distress.  VITAL SIGNS: Temperature is 35.5, pulse of 93, blood pressure of 120/60, respiratory rate is 12. HEENT: He has some periorbital edema.  No icterus.  Oropharynx  without lesions. NECK:  Supple.  No lymphadenopathy. LUNGS:  Have rhonchi and wheezes on the right side. CARDIAC:  Regular rate and rhythm.  No precordial rub appreciated. ABDOMEN:  Normoactive bowel sounds.  Soft, nontender. EXTREMITIES:  Left upper arm AV fistula with palpable thrill and audible bruit.  No pitting edema.  Labs are pending.  Most recent potassium is 4.4.  Yesterday his hemoglobin was 11.3.  ASSESSMENT/PLAN: 1. Recurrent right pleural effusion, status post thoracotomy and     decortication by [name].  I will continue to follow. 2. End-stage renal disease.  Continue  with dialysis every Tuesday ,     Thursday, Saturday. 3. Hypertension.  Continue with clonidine and Norvasc. 4. Anemia of chronic disease.  Continue with iron and EPO. 5. Secondary hyperparathyroidism.  Continue with binders and vitamin     D.  We will follow calcium and phosphorus. 6. Vascular access is AV fistulas, mature and ready for use, palpable     thrill and audible bruit and we will continue to follow.         ______________________________ [name], M.D.    JC/MEDQ  D:  09/22/2011  T:  09/22/2011  Job:  [id number]  Electronically Signed by [name] M.D. on 10/04/2011 08:40:47 AM

## 2011-10-13 ENCOUNTER — Other Ambulatory Visit: Payer: Self-pay | Admitting: Thoracic Surgery

## 2011-10-13 DIAGNOSIS — J90 Pleural effusion, not elsewhere classified: Secondary | ICD-10-CM

## 2011-10-18 ENCOUNTER — Ambulatory Visit
Admission: RE | Admit: 2011-10-18 | Discharge: 2011-10-18 | Disposition: A | Discharge status: Home / Self Care | Payer: Medicare Other | Source: Ambulatory Visit | Attending: Thoracic Surgery | Admitting: Thoracic Surgery

## 2011-10-18 ENCOUNTER — Ambulatory Visit (INDEPENDENT_AMBULATORY_CARE_PROVIDER_SITE_OTHER): Payer: Self-pay | Admitting: Thoracic Surgery

## 2011-10-18 ENCOUNTER — Encounter: Payer: Self-pay | Admitting: Thoracic Surgery

## 2011-10-18 VITALS — BP 143/80 | HR 78 | Temp 97.0°F | Resp 20 | Ht 64.0 in | Wt 139.0 lb

## 2011-10-18 DIAGNOSIS — J90 Pleural effusion, not elsewhere classified: Secondary | ICD-10-CM

## 2011-10-18 NOTE — Progress Notes (Signed)
HPI the patient had a moderate amount of pain he was given a prescription for oxycodone and immediate release 5 mg #60. His chest x-ray was rather stable breath and actually is improving as far as the reaction in the right lower lobe with better aeration. His incisions are well-healed and he's feeling better overall. We'll see him back again in 4 weeks with chest X.   Current Outpatient Prescriptions  Medication Sig Dispense Refill  . amLODipine (NORVASC) 10 MG tablet Take 10 mg by mouth daily.        Marland Kitchen aspirin 81 MG tablet Take 81 mg by mouth daily.        . calcium acetate (PHOSLO) 667 MG capsule Take 2,001 mg by mouth 3 (three) times daily with meals. And takes one tablet (667) with snacks      . cloNIDine (CATAPRES) 0.1 MG tablet Take 0.1 mg by mouth 2 (two) times daily.        . Fluticasone-Salmeterol (ADVAIR DISKUS) 250-50 MCG/DOSE AEPB Inhale 1 puff into the lungs every 12 (twelve) hours.        . folic acid-vitamin b complex-vitamin c-selenium-zinc (DIALYVITE) 3 MG TABS Take 1 tablet by mouth daily. prior to dialysis on Tuesdays, Thursdays, and Saturdays      . furosemide (LASIX) 20 MG tablet Take 20 mg by mouth daily.        Marland Kitchen glucose blood (ACCU-CHEK COMPACT TEST DRUM) test strip 1 each by Other route as needed. Use as instructed       . HYDROcodone-acetaminophen (VICODIN) 5-500 MG per tablet Use one every 4 hours as needed for pain  30 tablet  0  . omeprazole (PRILOSEC) 20 MG capsule Take 20 mg by mouth daily.        Marland Kitchen oxyCODONE-acetaminophen (PERCOCET) 5-325 MG per tablet Take 1 tablet by mouth every 4 (four) hours as needed.        . promethazine (PHENERGAN) 25 MG tablet Take 12.5-25 mg by mouth 2 (two) times daily as needed. For nausea        . tiotropium (SPIRIVA) 18 MCG inhalation capsule Place 18 mcg into inhaler and inhale daily.        . traZODone (DESYREL) 50 MG tablet Take 50 mg by mouth at bedtime.           Review of Systems:   Physical Exam lungs are clear to  auscultation percussion incision is healing well   Diagnostic Tests: Chest x-ray shows decreased reaction on the right side   Impression status post decortication for a hemothorax Plan return in 4 weeks with chest x-ray

## 2011-11-03 ENCOUNTER — Encounter (HOSPITAL_COMMUNITY): Payer: Self-pay | Admitting: *Deleted

## 2011-11-03 ENCOUNTER — Emergency Department (HOSPITAL_COMMUNITY)
Admission: EM | Admit: 2011-11-03 | Discharge: 2011-11-04 | Disposition: A | Discharge status: Home / Self Care | Payer: Medicare Other | Attending: Emergency Medicine | Admitting: Emergency Medicine

## 2011-11-03 DIAGNOSIS — R0989 Other specified symptoms and signs involving the circulatory and respiratory systems: Secondary | ICD-10-CM | POA: Insufficient documentation

## 2011-11-03 DIAGNOSIS — R071 Chest pain on breathing: Secondary | ICD-10-CM | POA: Insufficient documentation

## 2011-11-03 DIAGNOSIS — F17200 Nicotine dependence, unspecified, uncomplicated: Secondary | ICD-10-CM | POA: Insufficient documentation

## 2011-11-03 DIAGNOSIS — F1021 Alcohol dependence, in remission: Secondary | ICD-10-CM | POA: Insufficient documentation

## 2011-11-03 DIAGNOSIS — N5089 Other specified disorders of the male genital organs: Secondary | ICD-10-CM | POA: Insufficient documentation

## 2011-11-03 DIAGNOSIS — F341 Dysthymic disorder: Secondary | ICD-10-CM | POA: Insufficient documentation

## 2011-11-03 DIAGNOSIS — I120 Hypertensive chronic kidney disease with stage 5 chronic kidney disease or end stage renal disease: Secondary | ICD-10-CM | POA: Insufficient documentation

## 2011-11-03 DIAGNOSIS — N186 End stage renal disease: Secondary | ICD-10-CM | POA: Insufficient documentation

## 2011-11-03 DIAGNOSIS — R609 Edema, unspecified: Secondary | ICD-10-CM | POA: Insufficient documentation

## 2011-11-03 DIAGNOSIS — R0789 Other chest pain: Secondary | ICD-10-CM

## 2011-11-03 NOTE — ED Notes (Signed)
Pt states groin swelling that started tonight. Just released from [facility] today for right side pain. Had lung surgery to remove fluid x2.

## 2011-11-04 ENCOUNTER — Emergency Department (HOSPITAL_COMMUNITY): Payer: Medicare Other

## 2011-11-04 LAB — CBC
HCT: 34 % — ABNORMAL LOW (ref 39.0–52.0)
Platelets: 282 10*3/uL (ref 150–400)
RBC: 3.76 MIL/uL — ABNORMAL LOW (ref 4.22–5.81)
RDW: 15.5 % (ref 11.5–15.5)
WBC: 10.6 10*3/uL — ABNORMAL HIGH (ref 4.0–10.5)

## 2011-11-04 LAB — URINALYSIS, ROUTINE W REFLEX MICROSCOPIC
Glucose, UA: NEGATIVE mg/dL
Ketones, ur: NEGATIVE mg/dL
Protein, ur: >300 mg/dL — AB
Urobilinogen, UA: 0.2 mg/dL (ref 0.0–1.0)

## 2011-11-04 LAB — COMPREHENSIVE METABOLIC PANEL
AST: 28 U/L (ref 0–37)
Albumin: 3 g/dL — ABNORMAL LOW (ref 3.5–5.2)
Alkaline Phosphatase: 314 U/L — ABNORMAL HIGH (ref 39–117)
BUN: 51 mg/dL — ABNORMAL HIGH (ref 6–23)
CO2: 24 mEq/L (ref 19–32)
Chloride: 87 mEq/L — ABNORMAL LOW (ref 96–112)
GFR calc non Af Amer: 6 mL/min — ABNORMAL LOW (ref >90)
Potassium: 4.3 mEq/L (ref 3.5–5.1)
Total Bilirubin: 0.6 mg/dL (ref 0.3–1.2)

## 2011-11-04 LAB — URINE MICROSCOPIC-ADD ON

## 2011-11-04 MED ORDER — FUROSEMIDE 20 MG PO TABS: 20.0000 mg | ORAL_TABLET | Freq: Two times a day (BID) | ORAL | Status: DC

## 2011-11-04 MED ORDER — OXYCODONE-ACETAMINOPHEN 5-325 MG PO TABS
1.0000 | ORAL_TABLET | Freq: Once | ORAL | Status: AC
  Administered 2011-11-04: 1 via ORAL
  Filled 2011-11-04: qty 1

## 2011-11-04 NOTE — ED Provider Notes (Signed)
History     CSN: [id number] Arrival date & time: 11/03/2011 11:56 PM   First MD Initiated Contact with Patient 11/04/11 0020      Chief Complaint  Patient presents with  . Groin Swelling  . Groin Pain  . Chest Pain    (Consider location/radiation/quality/duration/timing/severity/associated sxs/prior treatment) HPI Patient presents with complaint of scrotal swelling. He states he first noticed the swelling today while he was using the bathroom. He also complains of pain in the right side of his chest at the site of prior chest tube insertion. He states that he was taken off of his Lasix during his hospitalization for hemothorax. He denies any difficulty breathing. Pain in his chest wall is made worse with palpation. He has no anterior chest wall pain. He states he has chronic lower extremity edema which is similar to prior. He repeatedly requests pain medication. He has no other systemic symptoms. He has no difficulty urinating or pain with urination.  No testicular or scrotal pain.  He denies any new trauma to his chest or acute worsening in his pain.   Past Medical History  Diagnosis Date  . ESRD (end stage renal disease)   . Hypertension   . Pleural effusion   . Depression with anxiety   . Tobacco abuse disorder   . SOB (shortness of breath)   . Bruit   . Osteoarthritis   . ETOH abuse   . Homelessness   . H. pylori infection   . Erosive esophagitis   . Hemothorax, right     Past Surgical History  Procedure Date  . Total hip arthroplasty 05/2010  . Cholecystectomy   . Knee surgery     rt  . Av fistula placement 2010  . R vats drainage  of pl. eff.  talc pleurodesis, insertion of r  pleurx 06/12/11  . Pleural effusion drainage     Family History  Problem Relation Age of Onset  . Emphysema Father     was a smoker  . Heart disease Mother   . Cerebral aneurysm Brother     History  Substance Use Topics  . Smoking status: Current Everyday Smoker -- 0.5 packs/day for  40 years    Types: Cigarettes  . Smokeless tobacco: Never Used  . Alcohol Use: No      Review of Systems ROS reviewed and otherwise negative except for mentioned in HPI  Allergies  Aspirin; Codeine; Morphine and related; Nsaids; and Poison sumac extract  Home Medications   Current Outpatient Rx  Name Route Sig Dispense Refill  . CALCIUM ACETATE 667 MG PO CAPS Oral Take 2,001 mg by mouth 3 (three) times daily with meals. And takes one tablet (667) with snacks    . CLONIDINE HCL 0.1 MG PO TABS Oral Take 0.1 mg by mouth 2 (two) times daily.      Marland Kitchen FLUTICASONE-SALMETEROL 250-50 MCG/DOSE IN AEPB Inhalation Inhale 1 puff into the lungs every 12 (twelve) hours.      Marland Kitchen DIALYVITE 3000 3 MG PO TABS Oral Take 1 tablet by mouth daily. prior to dialysis on Tuesdays, Thursdays, and Saturdays    . OXYCODONE-ACETAMINOPHEN 5-325 MG PO TABS Oral Take 1 tablet by mouth every 4 (four) hours as needed.      Marland Kitchen RANITIDINE HCL 150 MG PO TABS Oral Take 150 mg by mouth 2 (two) times daily.      Marland Kitchen TIOTROPIUM BROMIDE MONOHYDRATE 18 MCG IN CAPS Inhalation Place 18 mcg into inhaler and inhale daily.      Marland Kitchen  TRAZODONE HCL 50 MG PO TABS Oral Take 50 mg by mouth at bedtime.      Marland Kitchen AMLODIPINE BESYLATE 10 MG PO TABS Oral Take 10 mg by mouth daily.      . ASPIRIN 81 MG PO TABS Oral Take 81 mg by mouth daily.      . FUROSEMIDE 20 MG PO TABS Oral Take 20 mg by mouth daily.      . FUROSEMIDE 20 MG PO TABS Oral Take 1 tablet (20 mg total) by mouth 2 (two) times daily. 30 tablet 0  . GLUCOSE BLOOD VI STRP Other 1 each by Other route as needed. Use as instructed     . HYDROCODONE-ACETAMINOPHEN 5-500 MG PO TABS  Use one every 4 hours as needed for pain 30 tablet 0  . OMEPRAZOLE 20 MG PO CPDR Oral Take 20 mg by mouth daily.      Marland Kitchen PROMETHAZINE HCL 25 MG PO TABS Oral Take 12.5-25 mg by mouth 2 (two) times daily as needed. For nausea        BP 131/85  Pulse 99  Temp(Src) 98.9 F (37.2 C) (Oral)  Resp 18  Ht 5\' 7"  (1.702  m)  Wt 140 lb (63.504 kg)  BMI 21.93 kg/m2  SpO2 95% Vitals noted Physical Exam Physical Examination: General appearance - alert, well appearing, and in no distress, disheveled Mental status - alert, oriented to person, place, and time Mouth - mucous membranes moist, pharynx normal without lesions Chest - clear to auscultation, no wheezes, rales or rhonchi, symmetric air entry, ttp over right chest wall at midaxillary line- incision site from prior chest tube c/d/i- some deformity of right chest wall- ribs, no crepitus Heart - normal rate, regular rhythm, normal S1, S2, no murmurs, rubs, clicks or gallops Abdomen - soft, nontender, nondistended, no masses or organomegaly GU Male - no penile lesions or discharge, no testicular masses or tenderness, no hernias, symmetric scrotal edema Neurological - strength 5/5 in extrmeities x 4, sensation grossly intact Musculoskeletal - no joint tenderness, deformity or swelling Extremities - peripheral pulses normal, 2+ pitting edema of ankles bilaterally, no clubbing or cyanosis Skin - normal coloration and turgor, no rashes, no suspicious skin lesions noted  ED Course  Procedures (including critical care time)  Labs Reviewed  CBC - Abnormal; Notable for the following:    WBC 10.6 (*)    RBC 3.76 (*)    Hemoglobin 11.2 (*)    HCT 34.0 (*)    All other components within normal limits  COMPREHENSIVE METABOLIC PANEL - Abnormal; Notable for the following:    Sodium 129 (*)    Chloride 87 (*)    Glucose, Bld 107 (*)    BUN 51 (*)    Creatinine, Ser 9.04 (*)    Albumin 3.0 (*)    Alkaline Phosphatase 314 (*)    GFR calc non Af Amer 6 (*)    GFR calc Af Amer 7 (*)    All other components within normal limits  URINALYSIS, ROUTINE W REFLEX MICROSCOPIC - Abnormal; Notable for the following:    Appearance HAZY (*)    Hgb urine dipstick LARGE (*)    Bilirubin Urine SMALL (*)    Protein, ur >300 (*)    Leukocytes, UA TRACE (*)    All other  components within normal limits  URINE MICROSCOPIC-ADD ON - Abnormal; Notable for the following:    Squamous Epithelial / LPF MANY (*)    Bacteria, UA MANY (*)  Casts GRANULAR CAST (*)    All other components within normal limits   Dg Chest 2 View  11/04/2011  *RADIOLOGY REPORT*  Clinical Data: Right-sided chest pain at the site of prior chest tube; history of pleural effusion and history of smoking.  CHEST - 2 VIEW  Comparison: Chest radiograph performed 10/18/2011  Findings: There is a persistent small right pleural effusion, with dense airspace opacification involving much of the right mid and lower lung zones.  This is essentially unchanged from the prior study.  An apparent trace right-sided pneumothorax is suspected, underlying the right-sided rib fractures; this is better characterized than on prior studies.  Mild vascular congestion is noted.  The left lung remains relatively clear.  The cardiomediastinal silhouette is borderline normal in size. Multiple displaced right-sided rib fractures are again noted.  IMPRESSION:  1.  Persistent small right pleural effusion, with dense airspace opacification involving much of the right mid and lower lung zones, essentially unchanged from the prior study. 2.  Apparent trace right-sided pneumothorax, underlying the right- sided rib fractures, better characterized than on prior studies. Multiple displaced right-sided rib fractures again seen.  Original Report Authenticated By: Tonia Ghent, M.D.     1. Scrotal edema   2. Peripheral edema   3. Chest wall pain       MDM  Patient presenting with scrotal edema as well as peripheral edema likely related to cessation of his Lasix approximately one month ago. He has no shortness of breath or pulmonary edema. He is status post chest tube and decortication after traumatic hemothorax several weeks ago. Chest x-ray reveals no significant changes in terms of prior rib fractures and fluid collection. He  continues to complain of pain in his right chest wall. Per chart review patient was given a prescription for #60    oxycodone on November 7 by CT surgery. Plan today is to restart his Lasix dose at 20 mg daily which was his prior dose. He has an appointment to followup with CT surgery that is Arty scheduled for December 5. He will also need to followup with his primary care doctor. He was given strict return precautions and is agreeable with this plan.      Ethelda Chick, MD 11/04/11 0230

## 2011-11-04 NOTE — ED Notes (Signed)
Pt states went to use the restroom & noticed his scrotum was swollen & hurting. Pt denies being sexually active.

## 2011-11-09 ENCOUNTER — Other Ambulatory Visit: Payer: Self-pay | Admitting: Thoracic Surgery

## 2011-11-09 DIAGNOSIS — J90 Pleural effusion, not elsewhere classified: Secondary | ICD-10-CM

## 2011-11-15 ENCOUNTER — Encounter: Payer: Self-pay | Admitting: *Deleted

## 2011-11-15 ENCOUNTER — Ambulatory Visit: Payer: Self-pay | Admitting: Thoracic Surgery

## 2011-11-15 ENCOUNTER — Other Ambulatory Visit: Payer: Self-pay | Admitting: Thoracic Surgery

## 2011-11-15 ENCOUNTER — Other Ambulatory Visit: Payer: Self-pay | Admitting: *Deleted

## 2011-11-15 DIAGNOSIS — G8918 Other acute postprocedural pain: Secondary | ICD-10-CM

## 2011-11-15 DIAGNOSIS — J90 Pleural effusion, not elsewhere classified: Secondary | ICD-10-CM

## 2011-11-15 MED ORDER — HYDROCODONE-ACETAMINOPHEN 7.5-500 MG PO TABS: 1.0000 | ORAL_TABLET | Freq: Four times a day (QID) | ORAL | Status: DC | PRN

## 2011-11-20 ENCOUNTER — Other Ambulatory Visit: Payer: Self-pay | Admitting: Thoracic Surgery

## 2011-11-20 DIAGNOSIS — J90 Pleural effusion, not elsewhere classified: Secondary | ICD-10-CM

## 2011-11-21 ENCOUNTER — Ambulatory Visit: Payer: Self-pay | Admitting: Thoracic Surgery

## 2011-11-22 ENCOUNTER — Ambulatory Visit (INDEPENDENT_AMBULATORY_CARE_PROVIDER_SITE_OTHER): Payer: Self-pay | Admitting: Thoracic Surgery

## 2011-11-22 ENCOUNTER — Encounter: Payer: Self-pay | Admitting: Thoracic Surgery

## 2011-11-22 ENCOUNTER — Ambulatory Visit
Admission: RE | Admit: 2011-11-22 | Discharge: 2011-11-22 | Disposition: A | Discharge status: Home / Self Care | Payer: Medicare Other | Source: Ambulatory Visit | Attending: Thoracic Surgery | Admitting: Thoracic Surgery

## 2011-11-22 VITALS — BP 172/80 | HR 84 | Resp 18 | Ht 64.0 in | Wt 139.0 lb

## 2011-11-22 DIAGNOSIS — J90 Pleural effusion, not elsewhere classified: Secondary | ICD-10-CM

## 2011-11-22 DIAGNOSIS — J8410 Pulmonary fibrosis, unspecified: Secondary | ICD-10-CM

## 2011-11-22 NOTE — Progress Notes (Signed)
HPI patient returns today. He was unable to tolerate the hydrocodone. We gave him a prescription for oxycodone IR 5 mg #60. His chest x-ray shows improved aeration on the right his incisions are well-healed. We'll see him back again in 2 months Current Outpatient Prescriptions  Medication Sig Dispense Refill  . amLODipine (NORVASC) 10 MG tablet Take 10 mg by mouth daily.        Marland Kitchen aspirin 81 MG tablet Take 81 mg by mouth daily.        . calcium acetate (PHOSLO) 667 MG capsule Take 2,001 mg by mouth 3 (three) times daily with meals. And takes one tablet (667) with snacks      . cloNIDine (CATAPRES) 0.1 MG tablet Take 0.1 mg by mouth 2 (two) times daily.        . Fluticasone-Salmeterol (ADVAIR DISKUS) 250-50 MCG/DOSE AEPB Inhale 1 puff into the lungs every 12 (twelve) hours.        . folic acid-vitamin b complex-vitamin c-selenium-zinc (DIALYVITE) 3 MG TABS Take 1 tablet by mouth daily. prior to dialysis on Tuesdays, Thursdays, and Saturdays      . furosemide (LASIX) 20 MG tablet Take 20 mg by mouth daily.        Marland Kitchen glucose blood (ACCU-CHEK COMPACT TEST DRUM) test strip 1 each by Other route as needed. Use as instructed       . omeprazole (PRILOSEC) 20 MG capsule Take 20 mg by mouth daily.        Marland Kitchen oxyCODONE-acetaminophen (PERCOCET) 5-325 MG per tablet Take 1 tablet by mouth every 4 (four) hours as needed.        . tiotropium (SPIRIVA) 18 MCG inhalation capsule Place 18 mcg into inhaler and inhale daily.        . traZODone (DESYREL) 50 MG tablet Take 50 mg by mouth at bedtime.           Review of Systems: Unchanged   Physical Exam lungs are clear to auscultation percussion    Diagnostic Tests: Chest x-ray shows improved aeration   Impression: Status post decortication end-stage renal disease   Plan: Return in 2 months with chest xray

## 2011-12-19 ENCOUNTER — Other Ambulatory Visit: Payer: Self-pay | Admitting: *Deleted

## 2011-12-19 DIAGNOSIS — G8918 Other acute postprocedural pain: Secondary | ICD-10-CM

## 2011-12-19 MED ORDER — OXYCODONE-ACETAMINOPHEN 5-325 MG PO TABS: 1.0000 | ORAL_TABLET | ORAL | Status: DC | PRN

## 2012-01-24 ENCOUNTER — Other Ambulatory Visit: Payer: Self-pay | Admitting: Thoracic Surgery

## 2012-01-24 DIAGNOSIS — J90 Pleural effusion, not elsewhere classified: Secondary | ICD-10-CM

## 2012-01-31 ENCOUNTER — Encounter: Payer: Self-pay | Admitting: Thoracic Surgery

## 2012-01-31 ENCOUNTER — Ambulatory Visit
Admission: RE | Admit: 2012-01-31 | Discharge: 2012-01-31 | Disposition: A | Discharge status: Home / Self Care | Payer: Medicare Other | Source: Ambulatory Visit | Attending: Thoracic Surgery | Admitting: Thoracic Surgery

## 2012-01-31 ENCOUNTER — Ambulatory Visit (INDEPENDENT_AMBULATORY_CARE_PROVIDER_SITE_OTHER): Payer: Medicare Other | Admitting: Thoracic Surgery

## 2012-01-31 VITALS — BP 170/96 | HR 80 | Resp 20 | Ht 64.0 in | Wt 139.0 lb

## 2012-01-31 DIAGNOSIS — Z9889 Other specified postprocedural states: Secondary | ICD-10-CM

## 2012-01-31 DIAGNOSIS — J8410 Pulmonary fibrosis, unspecified: Secondary | ICD-10-CM

## 2012-01-31 DIAGNOSIS — J90 Pleural effusion, not elsewhere classified: Secondary | ICD-10-CM

## 2012-01-31 NOTE — Progress Notes (Signed)
HPI chest x-ray taken today shows further improvement in the right lower lobe gradient is decortication. He still became a prescription for Lyrica 75 mg once a day. And a refill for oxycodone IR 5 mg #60. He complains of postthoracotomy pain in this area. his incisions are well-healed.    Current Outpatient Prescriptions  Medication Sig Dispense Refill  . amLODipine (NORVASC) 10 MG tablet Take 10 mg by mouth daily.        . calcium acetate (PHOSLO) 667 MG capsule Take 2,001 mg by mouth 3 (three) times daily with meals. And takes one tablet (667) with snacks      . cloNIDine (CATAPRES) 0.1 MG tablet Take 0.1 mg by mouth 2 (two) times daily.        . Fluticasone-Salmeterol (ADVAIR DISKUS) 250-50 MCG/DOSE AEPB Inhale 1 puff into the lungs every 12 (twelve) hours.        . folic acid-vitamin b complex-vitamin c-selenium-zinc (DIALYVITE) 3 MG TABS Take 1 tablet by mouth daily. prior to dialysis on Tuesdays, Thursdays, and Saturdays      . furosemide (LASIX) 20 MG tablet Take 20 mg by mouth daily.        Marland Kitchen glucose blood (ACCU-CHEK COMPACT TEST DRUM) test strip 1 each by Other route as needed. Use as instructed       . omeprazole (PRILOSEC) 20 MG capsule Take 20 mg by mouth daily.        Marland Kitchen tiotropium (SPIRIVA) 18 MCG inhalation capsule Place 18 mcg into inhaler and inhale daily.        . traZODone (DESYREL) 50 MG tablet Take 50 mg by mouth at bedtime.        Marland Kitchen oxyCODONE-acetaminophen (PERCOCET) 5-325 MG per tablet Take 1 tablet by mouth every 4 (four) hours as needed.  60 tablet  0     Review of Systems: continue chest wall pain    Physical Exam lungs are clear attestation percussion    Diagnostic Tests: chest x-ray shows further improvement in aeration on the right   Impression: fibro-thorax right status post decortication      return in 2 months with chest x-ray:

## 2012-02-28 ENCOUNTER — Ambulatory Visit (HOSPITAL_COMMUNITY): Payer: Medicare Other | Attending: Thoracic Surgery

## 2012-03-13 ENCOUNTER — Ambulatory Visit: Payer: Self-pay | Admitting: Vascular Surgery

## 2012-03-13 LAB — CBC
HCT: 40 % (ref 40.0–52.0)
HGB: 13.3 g/dL (ref 13.0–18.0)
MCH: 29.9 pg (ref 26.0–34.0)
Platelet: 189 10*3/uL (ref 150–440)
RBC: 4.46 10*6/uL (ref 4.40–5.90)
RDW: 16.5 % — ABNORMAL HIGH (ref 11.5–14.5)

## 2012-03-13 LAB — BASIC METABOLIC PANEL
Anion Gap: 13 (ref 7–16)
Calcium, Total: 8.7 mg/dL (ref 8.5–10.1)
EGFR (Non-African Amer.): 7 — ABNORMAL LOW
Glucose: 65 mg/dL (ref 65–99)
Sodium: 133 mmol/L — ABNORMAL LOW (ref 136–145)

## 2012-03-20 ENCOUNTER — Ambulatory Visit: Payer: Self-pay | Admitting: Vascular Surgery

## 2012-03-21 IMAGING — CR DG CHEST 2V
2 series · 2 of 2 positions shown · non-contrast
Comparison: 09/06/2011 chest CT.

CLINICAL DATA: Preop.  End-stage renal disease.  Right pleural
effusion.

CHEST - 2 VIEW

[view not recorded (1 of 2)]
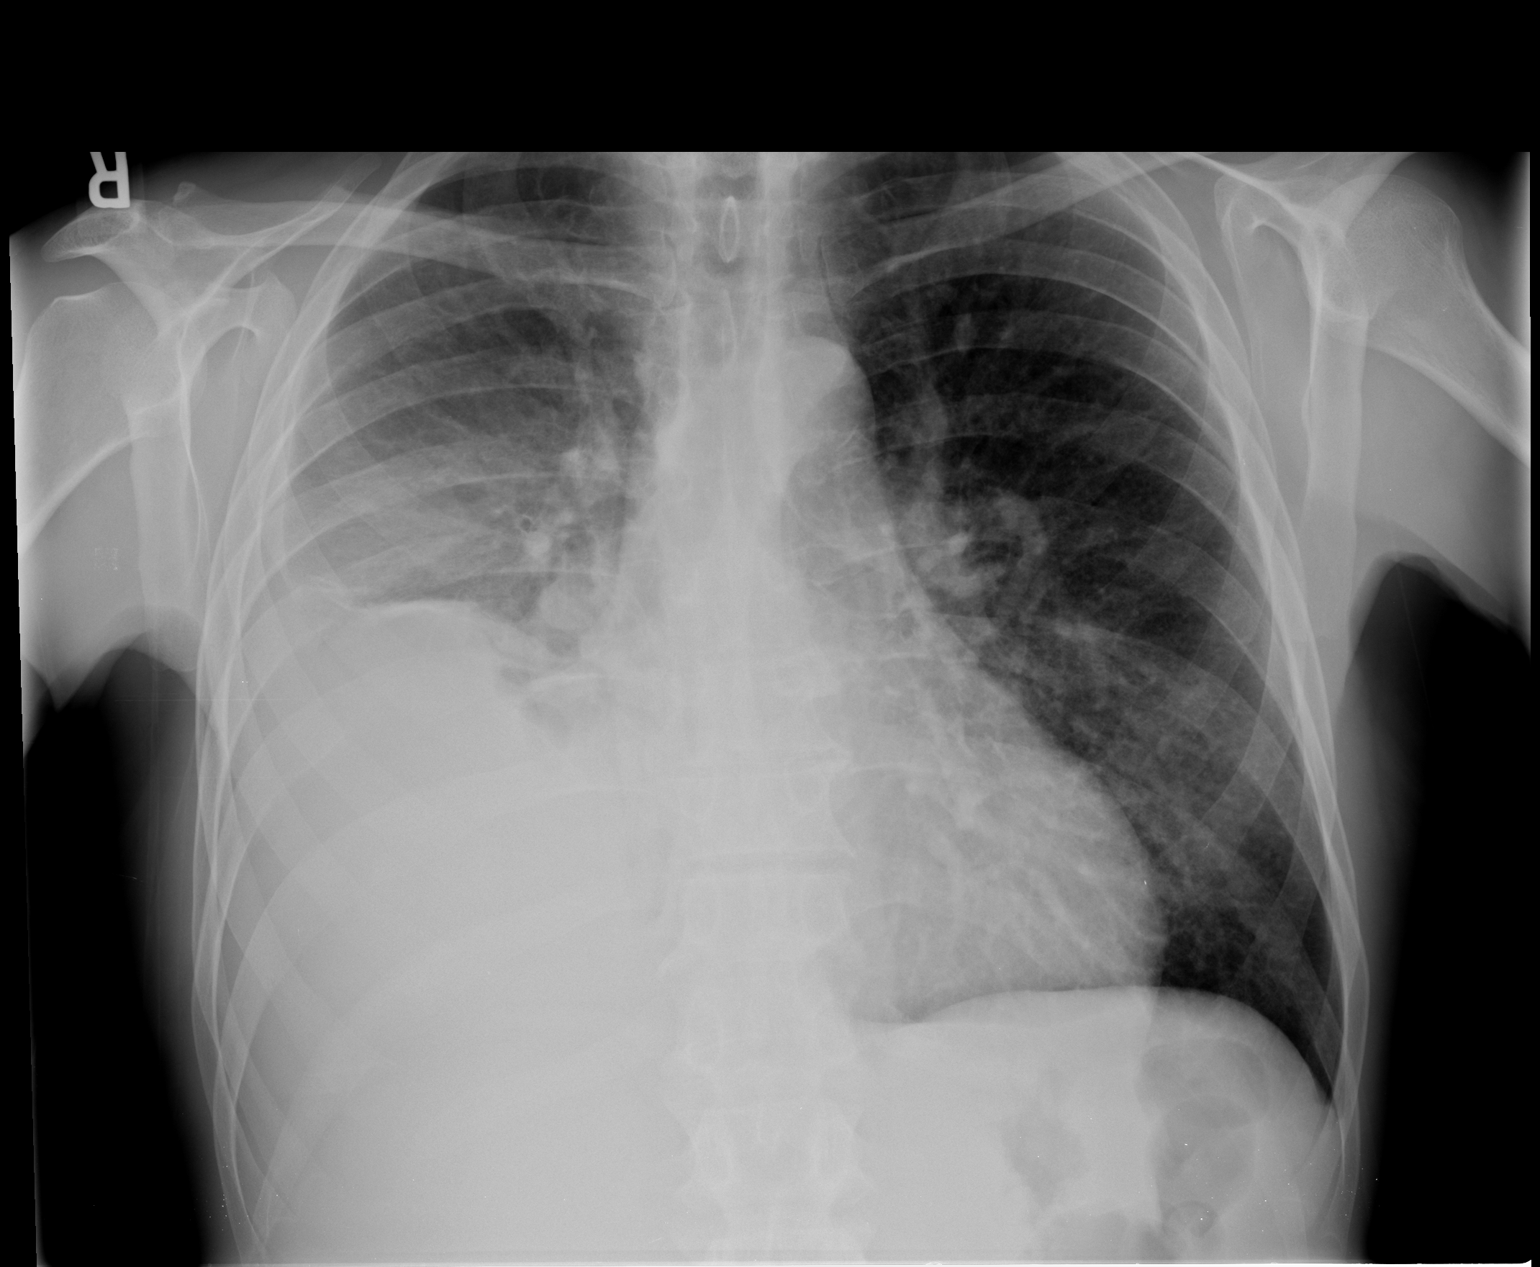

[view not recorded (2 of 2)]
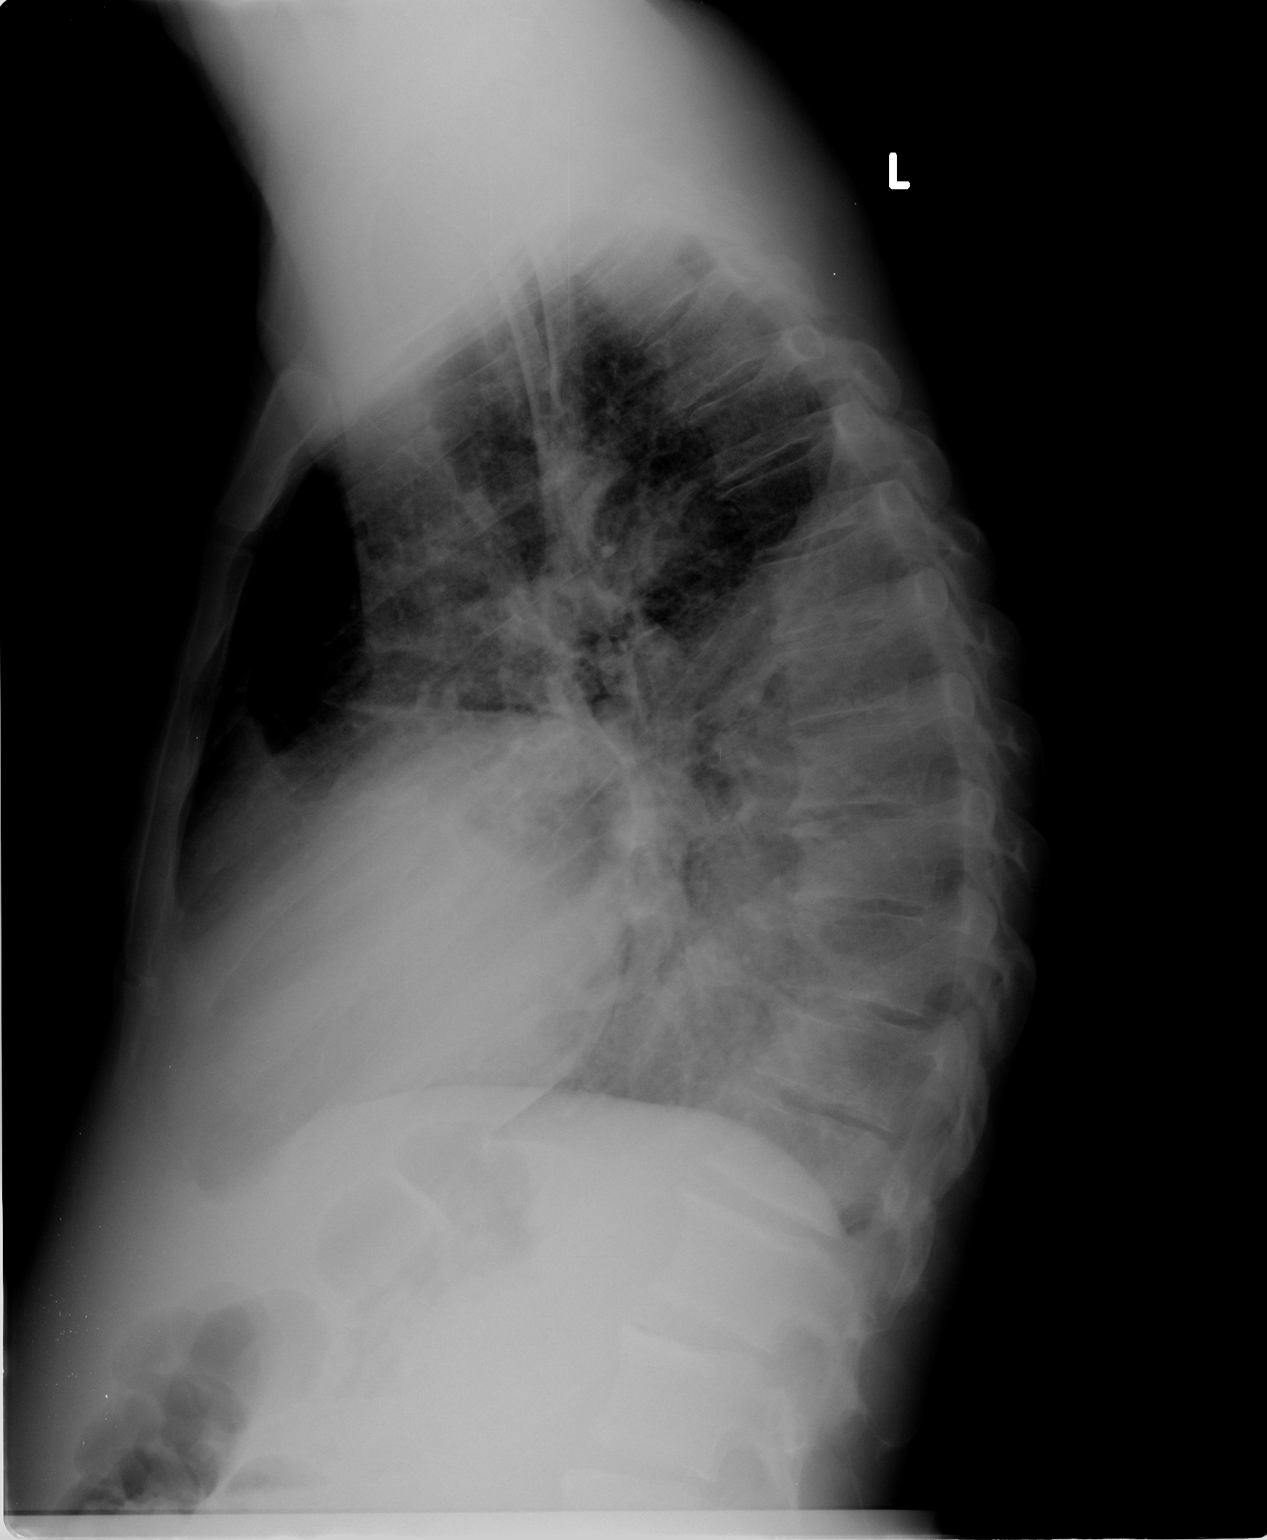

[2 of 2 positions shown; findings below may reference images not displayed]

FINDINGS: Opacification right thorax most notable mid to inferior
aspect corresponding to the CT demonstrated a complex loculated
pleural effusion and adjacent lung parenchymal changes.

Cardiomegaly.  Pulmonary vascular prominence/mild congestive heart
failure.  No pneumothorax.

Limited for evaluating CT detected mediastinal adenopathy.

Mild thoracic kyphosis.
IMPRESSION: Opacification right thorax most notable mid to inferior aspect
corresponding to the CT demonstrated a complex loculated pleural
effusion and adjacent lung parenchymal changes.

Cardiomegaly.

Pulmonary vascular prominence.

## 2012-03-28 ENCOUNTER — Other Ambulatory Visit: Payer: Self-pay | Admitting: Thoracic Surgery

## 2012-03-28 DIAGNOSIS — J90 Pleural effusion, not elsewhere classified: Secondary | ICD-10-CM

## 2012-04-03 ENCOUNTER — Ambulatory Visit
Admission: RE | Admit: 2012-04-03 | Discharge: 2012-04-03 | Disposition: A | Discharge status: Home / Self Care | Payer: Medicare Other | Source: Ambulatory Visit | Attending: Thoracic Surgery | Admitting: Thoracic Surgery

## 2012-04-03 ENCOUNTER — Ambulatory Visit (INDEPENDENT_AMBULATORY_CARE_PROVIDER_SITE_OTHER): Payer: Medicare Other | Admitting: Thoracic Surgery

## 2012-04-03 ENCOUNTER — Encounter: Payer: Self-pay | Admitting: Thoracic Surgery

## 2012-04-03 VITALS — BP 215/104 | HR 82 | Resp 20 | Ht 64.0 in | Wt 145.0 lb

## 2012-04-03 DIAGNOSIS — J8410 Pulmonary fibrosis, unspecified: Secondary | ICD-10-CM

## 2012-04-03 DIAGNOSIS — J90 Pleural effusion, not elsewhere classified: Secondary | ICD-10-CM

## 2012-04-03 DIAGNOSIS — Z9889 Other specified postprocedural states: Secondary | ICD-10-CM

## 2012-04-03 NOTE — Progress Notes (Signed)
HPI patient returns today still complaining of postthoracotomy pain. Chest x-ray is stable with some slight improvement in aeration no recurrence of his effusion. We gave him a refill proximal he stone IR 5 mg #60. We will not give him a followup appointment. He continues to have pain he needs to check with his renal physician.   Current Outpatient Prescriptions  Medication Sig Dispense Refill  . amLODipine (NORVASC) 10 MG tablet Take 10 mg by mouth daily.        . calcium acetate (PHOSLO) 667 MG capsule Take 2,001 mg by mouth 3 (three) times daily with meals. And takes one tablet (667) with snacks      . cloNIDine (CATAPRES) 0.1 MG tablet Take 0.1 mg by mouth 2 (two) times daily.        . Fluticasone-Salmeterol (ADVAIR DISKUS) 250-50 MCG/DOSE AEPB Inhale 1 puff into the lungs every 12 (twelve) hours.        . folic acid-vitamin b complex-vitamin c-selenium-zinc (DIALYVITE) 3 MG TABS Take 1 tablet by mouth daily. prior to dialysis on Tuesdays, Thursdays, and Saturdays      . furosemide (LASIX) 20 MG tablet Take 20 mg by mouth daily.        Marland Kitchen glucose blood (ACCU-CHEK COMPACT TEST DRUM) test strip 1 each by Other route as needed. Use as instructed       . omeprazole (PRILOSEC) 20 MG capsule Take 20 mg by mouth daily.        Marland Kitchen oxyCODONE-acetaminophen (PERCOCET) 5-325 MG per tablet Take 1 tablet by mouth every 4 (four) hours as needed.  60 tablet  0  . tiotropium (SPIRIVA) 18 MCG inhalation capsule Place 18 mcg into inhaler and inhale daily.        . traZODone (DESYREL) 50 MG tablet Take 50 mg by mouth at bedtime.           Review of Systems: Right chest wall pain   Physical Exam incisions are well healed lungs are clear to auscultation percussion bilaterally   Diagnostic Tests: Chest x-ray is stable with some reaction in the right base   Impression: Status post a loculated right pleural effusion status post decortication right thoracotomy   Plan: Return as needed

## 2012-10-07 ENCOUNTER — Encounter (HOSPITAL_COMMUNITY): Payer: Self-pay | Admitting: *Deleted

## 2012-10-07 ENCOUNTER — Ambulatory Visit: Payer: Self-pay | Admitting: Vascular Surgery

## 2012-10-07 ENCOUNTER — Emergency Department (HOSPITAL_COMMUNITY)
Admission: EM | Admit: 2012-10-07 | Discharge: 2012-10-07 | Disposition: A | Discharge status: Home / Self Care | Payer: Medicare Other | Attending: Emergency Medicine | Admitting: Emergency Medicine

## 2012-10-07 DIAGNOSIS — Z79899 Other long term (current) drug therapy: Secondary | ICD-10-CM | POA: Insufficient documentation

## 2012-10-07 DIAGNOSIS — N186 End stage renal disease: Secondary | ICD-10-CM | POA: Insufficient documentation

## 2012-10-07 DIAGNOSIS — L732 Hidradenitis suppurativa: Secondary | ICD-10-CM | POA: Insufficient documentation

## 2012-10-07 DIAGNOSIS — I10 Essential (primary) hypertension: Secondary | ICD-10-CM | POA: Insufficient documentation

## 2012-10-07 DIAGNOSIS — L03818 Cellulitis of other sites: Secondary | ICD-10-CM | POA: Insufficient documentation

## 2012-10-07 DIAGNOSIS — Z8709 Personal history of other diseases of the respiratory system: Secondary | ICD-10-CM | POA: Insufficient documentation

## 2012-10-07 DIAGNOSIS — F341 Dysthymic disorder: Secondary | ICD-10-CM | POA: Insufficient documentation

## 2012-10-07 DIAGNOSIS — L02811 Cutaneous abscess of head [any part, except face]: Secondary | ICD-10-CM

## 2012-10-07 DIAGNOSIS — K228 Other specified diseases of esophagus: Secondary | ICD-10-CM | POA: Insufficient documentation

## 2012-10-07 DIAGNOSIS — Z8619 Personal history of other infectious and parasitic diseases: Secondary | ICD-10-CM | POA: Insufficient documentation

## 2012-10-07 DIAGNOSIS — Z992 Dependence on renal dialysis: Secondary | ICD-10-CM | POA: Insufficient documentation

## 2012-10-07 DIAGNOSIS — F17200 Nicotine dependence, unspecified, uncomplicated: Secondary | ICD-10-CM | POA: Insufficient documentation

## 2012-10-07 DIAGNOSIS — L02818 Cutaneous abscess of other sites: Secondary | ICD-10-CM | POA: Insufficient documentation

## 2012-10-07 DIAGNOSIS — K2289 Other specified disease of esophagus: Secondary | ICD-10-CM | POA: Insufficient documentation

## 2012-10-07 DIAGNOSIS — M1990 Unspecified osteoarthritis, unspecified site: Secondary | ICD-10-CM | POA: Insufficient documentation

## 2012-10-07 HISTORY — DX: Dependence on renal dialysis: Z99.2

## 2012-10-07 MED ORDER — SULFAMETHOXAZOLE-TMP DS 800-160 MG PO TABS
1.0000 | ORAL_TABLET | Freq: Once | ORAL | Status: AC
  Administered 2012-10-07: 1 via ORAL
  Filled 2012-10-07: qty 1

## 2012-10-07 MED ORDER — LIDOCAINE HCL (PF) 2 % IJ SOLN
5.0000 mL | Freq: Once | INTRAMUSCULAR | Status: AC
  Administered 2012-10-07: 5 mL
  Filled 2012-10-07: qty 10

## 2012-10-07 MED ORDER — SULFAMETHOXAZOLE-TRIMETHOPRIM 800-160 MG PO TABS: 1.0000 | ORAL_TABLET | Freq: Two times a day (BID) | ORAL | Status: AC

## 2012-10-07 NOTE — ED Notes (Signed)
Pt presents with upper rt neck abscess x 2 days. Pt also has small abscess on lower lumbar area. Mild edema noted to right side of face. Pt denies SOB, Dental pain, fever and n/v. Pt reports having dialysis today at [facility], [facility], and ? Was given antibiotics today. Pt has a history of MRSA.  Rt axilla also noted to have multiple abscesses in different stages. NAD noted at this time.

## 2012-10-07 NOTE — ED Notes (Signed)
Abscess to scalp for 2 days, says he has an abscess on his back also

## 2012-10-07 NOTE — ED Notes (Signed)
Dressing applied to back of neck. Pt tolerated well.

## 2012-10-07 NOTE — ED Provider Notes (Signed)
History   This chart was scribed for Dione Booze, MD by Gerlean Ren. This patient was seen in room APA11/APA11 and the patient's care was started at 14:18.   CSN: [id number]  Arrival date & time 10/07/12  1231   First MD Initiated Contact with Patient 10/07/12 1413      Chief Complaint  Patient presents with  . Abscess    (Consider location/radiation/quality/duration/timing/severity/associated sxs/prior treatment) The history is provided by the patient. No language interpreter was used.   [name] is a 51 y.o. male who presents to the Emergency Department complaining of a painful abscess on right occiput first noticed 2 days ago with no improving factors for pain.  Pt also reports small nodules over right axilla noticed 2 days ago.  Pt reports associated fever.  Pt denies sore throat, cough, dyspnea, and HA as associated.  Pt has h/o HTN and is on dialysis.  Pt is a current everyday smoker but denies alcohol use.     Past Medical History  Diagnosis Date  . Hypertension   . Pleural effusion   . Depression with anxiety   . Tobacco abuse disorder   . SOB (shortness of breath)   . Bruit   . Osteoarthritis   . ETOH abuse   . H. pylori infection   . Erosive esophagitis   . Hemothorax, right   . ESRD (end stage renal disease)   . Dialysis patient     Past Surgical History  Procedure Date  . Total hip arthroplasty 05/2010  . Cholecystectomy   . Av fistula placement 2010  . R vats drainage  of pl. eff.  talc pleurodesis, insertion of r  pleurx 06/12/11  . Pleural effusion drainage   . Joint replacement     Family History  Problem Relation Age of Onset  . Emphysema Father     was a smoker  . Heart disease Mother   . Cerebral aneurysm Brother     History  Substance Use Topics  . Smoking status: Current Every Day Smoker -- 0.5 packs/day for 40 years    Types: Cigarettes  . Smokeless tobacco: Never Used  . Alcohol Use: No      Review of Systems  All other  systems reviewed and are negative.    Allergies  Aspirin; Morphine and related; Codeine; Nsaids; and Poison sumac extract  Home Medications   Current Outpatient Rx  Name Route Sig Dispense Refill  . AMLODIPINE BESYLATE 10 MG PO TABS Oral Take 10 mg by mouth daily.      Marland Kitchen CALCIUM ACETATE 667 MG PO CAPS Oral Take 2,001 mg by mouth 3 (three) times daily with meals. And takes one tablet (667) with snacks    . CLONIDINE HCL 0.1 MG PO TABS Oral Take 0.1 mg by mouth 2 (two) times daily.      Marland Kitchen FLUTICASONE-SALMETEROL 250-50 MCG/DOSE IN AEPB Inhalation Inhale 1 puff into the lungs every 12 (twelve) hours.      Marland Kitchen DIALYVITE 3000 3 MG PO TABS Oral Take 1 tablet by mouth daily. 12 hours after dialysis on Tuesdays, Thursdays, and Saturdays    . FUROSEMIDE 20 MG PO TABS Oral Take 20 mg by mouth daily.      Marland Kitchen LIDOCAINE-PRILOCAINE 2.5-2.5 % EX CREA Topical Apply 1 application topically as needed. During Dialysis    . TIOTROPIUM BROMIDE MONOHYDRATE 18 MCG IN CAPS Inhalation Place 18 mcg into inhaler and inhale daily.      . TRAZODONE HCL  50 MG PO TABS Oral Take 50 mg by mouth at bedtime as needed. Sleep    . OXYCODONE-ACETAMINOPHEN 5-325 MG PO TABS Oral Take 1 tablet by mouth every 4 (four) hours as needed. Pain      BP 191/97  Pulse 83  Temp 98.1 F (36.7 C) (Oral)  Resp 20  Ht 5\' 6"  (1.676 m)  Wt 142 lb (64.411 kg)  BMI 22.92 kg/m2  SpO2 100%  Physical Exam  Nursing note and vitals reviewed. Constitutional: He is oriented to person, place, and time. He appears well-developed and well-nourished.  HENT:  Head: Normocephalic and atraumatic.       4cmx4cm abscess on right occiput .  Eyes: EOM are normal.  Neck: Normal range of motion. No tracheal deviation present.  Cardiovascular: Normal rate, regular rhythm and normal heart sounds.   No murmur heard. Pulmonary/Chest: Effort normal and breath sounds normal. He has no wheezes.  Abdominal: Soft. Bowel sounds are normal. There is no tenderness.   Musculoskeletal: Normal range of motion. He exhibits no edema.  Neurological: He is alert and oriented to person, place, and time.  Skin: Skin is warm.       Several erythematous nodules over right axilla consistent with hydradenitis suppurativa.  No fluctuance or drainable abscess.    Psychiatric: He has a normal mood and affect.    ED Course  INCISION AND DRAINAGE Date/Time: 10/07/2012 3:04 PM Performed by: Dione Booze Authorized by: Preston Fleeting, Chace Klippel Consent: Verbal consent obtained. Written consent not obtained. Risks and benefits: risks, benefits and alternatives were discussed Consent given by: patient Patient understanding: patient states understanding of the procedure being performed Patient consent: the patient's understanding of the procedure matches consent given Procedure consent: procedure consent matches procedure scheduled Relevant documents: relevant documents present and verified Site marked: the operative site was marked Required items: required blood products, implants, devices, and special equipment available Patient identity confirmed: verbally with patient and arm band Time out: Immediately prior to procedure a "time out" was called to verify the correct patient, procedure, equipment, support staff and site/side marked as required. Type: abscess Body area: head/neck Location details: scalp Anesthesia: local infiltration Local anesthetic: lidocaine 1% without epinephrine Patient sedated: no Scalpel size: 11 Incision type: single straight Complexity: complex Drainage: purulent Drainage amount: moderate Wound treatment: wound left open Patient tolerance: Patient tolerated the procedure well with no immediate complications. Comments: Wound probed to break up loculations.   (including critical care time) DIAGNOSTIC STUDIES: Oxygen Saturation is 100% on room air, normal by my interpretation.    COORDINATION OF CARE: 14:24- Patient informed of clinical course,  understands medical decision-making process, and agrees with plan.  Ordered I&D of abscess with xylocaine.     1. Abscess of scalp   2. Hidradenitis suppurativa       MDM  Scalp laceration which appears to need incision and drainage. Lesions of hidradenitis supporativa which are not as sufficient size to warrant incision and drainage. After incision and drainage of scalp laceration, he'll be sent home with prescription for Bactrim. He is requesting pain medication but he is advised that his pain should be significantly decrease this by incision and drainage.  I personally performed the services described in this documentation, which was scribed in my presence. The recorded information has been reviewed and considered.        Dione Booze, MD 10/07/12 [phone]

## 2013-03-26 DIAGNOSIS — I2510 Atherosclerotic heart disease of native coronary artery without angina pectoris: Secondary | ICD-10-CM

## 2013-07-15 ENCOUNTER — Encounter (HOSPITAL_COMMUNITY): Payer: Self-pay | Admitting: *Deleted

## 2013-07-15 ENCOUNTER — Emergency Department (HOSPITAL_COMMUNITY)
Admission: EM | Admit: 2013-07-15 | Discharge: 2013-08-11 | Disposition: E | Payer: Medicare Other | Attending: Emergency Medicine | Admitting: Emergency Medicine

## 2013-07-15 DIAGNOSIS — Z8709 Personal history of other diseases of the respiratory system: Secondary | ICD-10-CM | POA: Insufficient documentation

## 2013-07-15 DIAGNOSIS — F341 Dysthymic disorder: Secondary | ICD-10-CM | POA: Insufficient documentation

## 2013-07-15 DIAGNOSIS — I469 Cardiac arrest, cause unspecified: Secondary | ICD-10-CM

## 2013-07-15 DIAGNOSIS — I120 Hypertensive chronic kidney disease with stage 5 chronic kidney disease or end stage renal disease: Secondary | ICD-10-CM | POA: Insufficient documentation

## 2013-07-15 DIAGNOSIS — R0681 Apnea, not elsewhere classified: Secondary | ICD-10-CM | POA: Insufficient documentation

## 2013-07-15 DIAGNOSIS — F17200 Nicotine dependence, unspecified, uncomplicated: Secondary | ICD-10-CM | POA: Insufficient documentation

## 2013-07-15 DIAGNOSIS — J449 Chronic obstructive pulmonary disease, unspecified: Secondary | ICD-10-CM

## 2013-07-15 DIAGNOSIS — Z79899 Other long term (current) drug therapy: Secondary | ICD-10-CM | POA: Insufficient documentation

## 2013-07-15 DIAGNOSIS — Z8719 Personal history of other diseases of the digestive system: Secondary | ICD-10-CM | POA: Insufficient documentation

## 2013-07-15 DIAGNOSIS — Z992 Dependence on renal dialysis: Secondary | ICD-10-CM | POA: Insufficient documentation

## 2013-07-15 DIAGNOSIS — Z8739 Personal history of other diseases of the musculoskeletal system and connective tissue: Secondary | ICD-10-CM | POA: Insufficient documentation

## 2013-07-15 DIAGNOSIS — Z8619 Personal history of other infectious and parasitic diseases: Secondary | ICD-10-CM | POA: Insufficient documentation

## 2013-07-15 DIAGNOSIS — N186 End stage renal disease: Secondary | ICD-10-CM | POA: Insufficient documentation

## 2013-07-15 DIAGNOSIS — J4489 Other specified chronic obstructive pulmonary disease: Secondary | ICD-10-CM | POA: Insufficient documentation

## 2013-07-15 DIAGNOSIS — R1110 Vomiting, unspecified: Secondary | ICD-10-CM | POA: Insufficient documentation

## 2013-08-11 NOTE — ED Notes (Addendum)
Pt found unresponsive at home - hemodialysis pt who had completed a dialysis session approx 1 hour prior to being found. CPR was begun by EMS at 1559. Per EMS, pt remained pulseless, apneic, with v-fib or asystole on monitor since CPR was begun. Pt received Epi 1mg  x 6, 3 defibrillations, calcium chloride, sodium bicarb 1 amp PTA. Arrives to ED, CPR in progress; asystole on monitor when CPR is paused.  [place] in place, receiving ventilations via bagging.

## 2013-08-11 NOTE — ED Notes (Signed)
Contacted [facility]; spoke with Duanne Limerick; ref # [id number]; pt candidate for bone, skin, eye and heart valve donation.

## 2013-08-11 NOTE — ED Provider Notes (Addendum)
CSN: [id number]     Arrival date & time    History     None    Chief Complaint  Patient presents with  . Cardiac Arrest   LEVEL 5 CAVEAT FOR UNRESPONSIVE  (Consider location/radiation/quality/duration/timing/severity/associated sxs/prior Treatment) HPI  Patient seen on arrival  Per daughter and his estranged wife patient had dialysis today. They report he was feeling fine. However they report after dialysis use he feels bad and has vomiting. When he got home from dialysis today he had some vomiting. He usually takes a shower which he did today. However had vomiting in the shower. The daughter then thought she heard a thump and when he did not respond she found him laying face down in the bathroom. He seemed to be breathing hard. They rolled him over and  he had some vomiting again after that. EMS was called because he was not responsive. First responders reported an initial pulse of 50. However paramedics state on their arrival patient was in V. fib. He was intubated and an IO was started. Was given epinephrine x4, lidocaine x1, calcium 1 amp of bicarbonate one amp. He was cardioverted x3. They report they never had a pulse, or got a rhythm back. They state their rhythm on the monitor was either V. fib or asystole.  PCP Dr Teena Dunk in [facility] in [place]  Past Medical History  Diagnosis Date  . Hypertension   . Pleural effusion   . Depression with anxiety   . Tobacco abuse disorder   . SOB (shortness of breath)   . Bruit   . Osteoarthritis   . ETOH abuse   . H. pylori infection   . Erosive esophagitis   . Hemothorax, right   . ESRD (end stage renal disease)   . Dialysis patient    Past Surgical History  Procedure Laterality Date  . Total hip arthroplasty  05/2010  . Cholecystectomy    . Av fistula placement  2010  . R vats drainage  of pl. eff.  talc pleurodesis, insertion of r  pleurx  06/12/11  . Pleural effusion drainage    . Joint replacement     Family History   Problem Relation Age of Onset  . Emphysema Father     was a smoker  . Heart disease Mother   . Cerebral aneurysm Brother    History  Substance Use Topics  . Smoking status: Current Every Day Smoker -- 0.50 packs/day for 40 years    Types: Cigarettes  . Smokeless tobacco: Never Used  . Alcohol Use: No  lives at home Lives with family  Review of Systems  Unable to perform ROS: Mental status change    Allergies  Aspirin; Morphine and related; Codeine; Nsaids; and Poison sumac extract  Home Medications   Current Outpatient Rx  Name  Route  Sig  Dispense  Refill  . amLODipine (NORVASC) 10 MG tablet   Oral   Take 10 mg by mouth daily.           . calcium acetate (PHOSLO) 667 MG capsule   Oral   Take 2,001 mg by mouth 3 (three) times daily with meals. And takes one tablet (667) with snacks         . cloNIDine (CATAPRES) 0.1 MG tablet   Oral   Take 0.1 mg by mouth 2 (two) times daily.           . Fluticasone-Salmeterol (ADVAIR DISKUS) 250-50 MCG/DOSE AEPB   Inhalation  Inhale 1 puff into the lungs every 12 (twelve) hours.           . folic acid-vitamin b complex-vitamin c-selenium-zinc (DIALYVITE) 3 MG TABS   Oral   Take 1 tablet by mouth daily. 12 hours after dialysis on Tuesdays, Thursdays, and Saturdays         . furosemide (LASIX) 20 MG tablet   Oral   Take 20 mg by mouth daily.           Marland Kitchen lidocaine-prilocaine (EMLA) cream   Topical   Apply 1 application topically as needed. During Dialysis         . sulfamethoxazole-trimethoprim (BACTRIM DS) 800-160 MG per tablet   Oral   Take 1 tablet by mouth 2 (two) times daily.   15 tablet   0   . tiotropium (SPIRIVA) 18 MCG inhalation capsule   Inhalation   Place 18 mcg into inhaler and inhale daily.           . traZODone (DESYREL) 50 MG tablet   Oral   Take 50 mg by mouth at bedtime as needed. Sleep           Physical Exam  Nursing note and vitals reviewed. Constitutional: He appears  well-developed and well-nourished.  Non-toxic appearance. He does not appear ill. He is intubated.  Chronically ill appearing male  HENT:  Head: Normocephalic and atraumatic.  Right Ear: External ear normal.  Left Ear: External ear normal.  Nose: Nose normal. No mucosal edema or rhinorrhea.  Mouth/Throat: Oropharynx is clear and moist and mucous membranes are normal. No dental abscesses or edematous.  Patient has [place] in his mouth  Eyes: Conjunctivae and EOM are normal. Pupils are equal, round, and reactive to light.  Neck: Normal range of motion and full passive range of motion without pain. Neck supple.  Cardiovascular:  No heart tones heard  Pulmonary/Chest: Apnea noted. He is intubated. He has decreased breath sounds. He exhibits no crepitus.  Abdominal: Soft. He exhibits distension. Bowel sounds are decreased.  Musculoskeletal: Normal range of motion. He exhibits no edema and no tenderness.  Patient has large fistula in his left upper arm  Neurological: He has normal strength. He is unresponsive.  Skin: Skin is dry and intact. No rash noted. No erythema. There is pallor.  Patient had some cyanosis in his face    ED Course   Procedures (including critical care time)   Patient had CPR continued on arrival to the ED. He had no spontaneous respirations. He had no peripheral pulses. Color was poor. The CPR was briefly stopped and his rhythm was noted to be a fine V. fib. He was defibrillated x1 and then went into asystole. CPR was stopped, pt had no pulse, no heart tones and no spontaneous respirations and patient was pronounced dead at 1638 PM. I talked to his family at 72 and got further history. They report they think his PCP is Dr. Teena Dunk in [place].  17:02 Dr. Teena Dunk states he is a gastroenterologist. He was at the hospital however and looked in the computer and told me the patient's PCP is Dr. Loney Hering.   17:16 Dr Margo Common, on call for Dr Loney Hering, verifies Dr Loney Hering is his PCP,  states Dr Loney Hering sometimes is not willing to sign the death certificate.   I will talk to ME who can talk to Dr Loney Hering.   17:23 Dr Uvaldo Rising, ME states ESRD can be cause of death and Dr Margo Common can sign his  death certificate, is not a ME case. States by state law the PCP is supposed to sign the death certificate, whether it is Dr Loney Hering or Dr Margo Common who is covering for him   17:31 Dr Kristian Covey states his PCP should sign the death certificate    1. Cardiac arrest   2. ESRD (end stage renal disease)   3. COPD (chronic obstructive pulmonary disease)     Expired   Devoria Albe, MD, FACEP   MDM    Ward Givens, MD 08/02/13 1725  Ward Givens, MD 08/02/2013 [id number]

## 2013-08-11 DEATH — deceased

## 2015-03-30 NOTE — Op Note (Signed)
PATIENT NAME:  [name], [name] MR#:  [id number] DATE OF BIRTH:  Jul 28, 1961  DATE OF PROCEDURE:  10/07/2012  PREOPERATIVE DIAGNOSES:  1. End-stage renal disease.  2. Poorly functioning right arm AV fistula.  3. Hypertension.   POSTOPERATIVE DIAGNOSES: 1. End-stage renal disease.  2. Poorly functioning right arm AV fistula.  3. Hypertension.   PROCEDURES:  1. Ultrasound guidance for vascular access to right arm AV fistula.  2. Percutaneous transluminal angioplasty of paraanastomotic stenosis and a mid forearm cephalic vein stenosis with 6 and 7 mm diameter angioplasty balloon.  3. Coil embolization of a cephalic vein branch with a 5 and a 6 mm coil.   SURGEON: Annice NeedyJason S. Baylin Gamblin, MD    ANESTHESIA: Local with moderate conscious sedation.   ESTIMATED BLOOD LOSS: Minimal.   INDICATION FOR PROCEDURE: This is a 54 year old white male with end-stage renal disease. He has a fistula which is not functioning properly with decreased flows and we are asked to assess this.   DESCRIPTION OF PROCEDURE: The patient was brought to the Vascular Interventional Radiology Suite. The right upper extremity was sterilely prepped and draped and a sterile surgical field was created. The fistula was accessed without difficulty under direct ultrasound guidance just below the antecubital fossa in a retrograde fashion and permanent image was recorded. A micropuncture sheath was placed. Imaging performed through this showed the remainder of the central venous circulation in the upper arm which was patent but there did appear to be some irregularity in the superior vena cava with some degree of stenosis although this does not appear high-grade. A Kumpe catheter was advanced to the paraanastomotic area. There was a high-grade stenosis just beyond the anastomosis and then a moderate high-grade stenosis approximately 8 to 10 cm further downstream at an area of a large branch. There were three branches that could also be limiting the  flow from the fistula. I placed a Magic torque wire across the anastomosis encompassing lesions and initially a 6 mm diameter angioplasty balloon was inflated in the paraanastomotic stenosis and then deflated and then reinflated at the cephalic vein stenosis. Waists were taken in both locations. Following this there was still some residual stenosis and I treated both areas with a 7 mm diameter angioplasty balloon with improved flow and only mild residual stenosis of less than 30% after angioplasty. At this point I elected to embolize one of the three competing branches. This was a small to medium-sized branch. The largest branch was not particularly well oriented to embolize from a retrograde approach. I was able to cannulate this with a Magic torque wire and a Kumpe catheter and advance well out the branch. A 6 mm diameter x 14 cm length and a 5 mm diameter x 5 cm length coil was used to treat the branch and occlude it staying out of the main fistula. At this point I elected to terminate the procedure. The sheath was removed around a 4-0 Monocryl suture. Pressure was held. Sterile dressing was placed. The patient tolerated the procedure well.   ____________________________ Annice NeedyJason S. Bogdan Vivona, MD jsd:drc D: 10/07/2012 10:25:03 ET T: 10/07/2012 10:39:08 ET JOB#: [id number]  cc: Annice NeedyJason S. Kirtis Challis, MD, <Dictator> Annice NeedyJASON S Larsen Zettel MD ELECTRONICALLY SIGNED 10/09/2012 11:36

## 2015-04-04 NOTE — Op Note (Signed)
PATIENT NAME:  [name], [name]:  [id number] DATE OF BIRTH:  1961-06-28  DATE OF PROCEDURE:  03/20/2012  PREOPERATIVE DIAGNOSES:  1. End-stage renal disease.  2. 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   POSTOPERATIVE DIAGNOSES:  1. End-stage renal disease.  2. 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   PROCEDURE PERFORMED: Creation of a right radiocephalic fistula.   SURGEON: Renford DillsGregory G. Jagdeep Ancheta, MD   ANESTHESIA: General by LMA.   FLUIDS: Per anesthesia record.   ESTIMATED BLOOD LOSS: Minimal.   SPECIMEN: None.   INDICATIONS: Mr. [name] is a 54 year old gentleman with multiple medical problems who presented to the office with increasing aneurysmal degeneration and difficulties with dialysis using his left brachiocephalic fistula. He is, therefore, undergoing creation of a right arm fistula with the intention of ligating the left one when the right fistula is usable. Risks and benefits were reviewed. Questions are answered. The patient agrees to proceed.   PROCEDURE: The patient is taken to the operating room and placed in the supine position. After adequate general anesthesia is induced, the patient's right arm is prepped and draped in a sterile fashion. 1% lidocaine is infiltrated in the soft tissues in between the cephalic vein and the radial artery impulse. Dissection is carried down to expose the vein which appears adequate for creation of a fistula and, therefore, the radial artery is exposed. Radial artery is noted to be very scarred and densely adherent to the surrounding tissues. It is also quite small. Once adequate length has been exposed, it is looped proximally and distally with blue silastic vessel loops.   The cephalic vein is then marked with a surgical marker and dissected circumferentially ligating the distal end with 3-0 Vicryl. It is then transected. Garrett  coronary dilators are then passed. Only a 3 mm dilator will advance. The vein is rather small. The radial artery is then occluded proximally and distally with vessel loops. Arteriotomy is made and extended with Potts scissors. Vein is spatulated using the [facility] coronary dilator. Only a 2 mm Gerre PebblesGarrett coronary dilator will advance proximally and distally through the radial artery. End vein to side radial artery anastomosis was then fashioned using running 6-0 Prolene. Flushing maneuvers were performed and flow is established through the fistula. The fistula is then exposed more proximally. A large side branch is noted and this is ligated with a 2-0 silk.   The wound is then irrigated, inspected for hemostasis, and closed using interrupted 3-0 Vicryl followed by 4-0 Monocryl subcuticular and then Dermabond. The patient tolerated the procedure well and there were no immediate complications.   ____________________________ Renford DillsGregory G. Davontae Prusinski, MD ggs:drc D: 03/20/2012 09:49:48 ET T: 03/20/2012 11:34:53 ET JOB#: [id number]  cc: Renford DillsGregory G. Keryn Nessler, MD, <Dictator> Renford DillsGREGORY G Salli Bodin MD ELECTRONICALLY SIGNED 03/20/2012 14:09
